# Patient Record
Sex: Female | Born: 1950 | Race: White | Hispanic: No | Marital: Married | Smoking: Never smoker
Health system: Southern US, Community
[De-identification: ages and names within clinical notes are randomized; demographics above are authoritative.]

## PROBLEM LIST (undated history)

## (undated) DIAGNOSIS — M109 Gout, unspecified: Secondary | ICD-10-CM

## (undated) DIAGNOSIS — R011 Cardiac murmur, unspecified: Secondary | ICD-10-CM

## (undated) DIAGNOSIS — E669 Obesity, unspecified: Secondary | ICD-10-CM

## (undated) DIAGNOSIS — H25013 Cortical age-related cataract, bilateral: Secondary | ICD-10-CM

## (undated) DIAGNOSIS — R51 Headache: Secondary | ICD-10-CM

## (undated) DIAGNOSIS — I10 Essential (primary) hypertension: Secondary | ICD-10-CM

## (undated) DIAGNOSIS — K635 Polyp of colon: Secondary | ICD-10-CM

## (undated) DIAGNOSIS — Z8489 Family history of other specified conditions: Secondary | ICD-10-CM

## (undated) DIAGNOSIS — M1990 Unspecified osteoarthritis, unspecified site: Secondary | ICD-10-CM

## (undated) DIAGNOSIS — K219 Gastro-esophageal reflux disease without esophagitis: Secondary | ICD-10-CM

## (undated) DIAGNOSIS — D649 Anemia, unspecified: Secondary | ICD-10-CM

## (undated) DIAGNOSIS — R519 Headache, unspecified: Secondary | ICD-10-CM

## (undated) DIAGNOSIS — E7800 Pure hypercholesterolemia, unspecified: Secondary | ICD-10-CM

## (undated) DIAGNOSIS — G4700 Insomnia, unspecified: Secondary | ICD-10-CM

## (undated) HISTORY — PX: COLONOSCOPY: SHX174

## (undated) HISTORY — PX: TUBAL LIGATION: SHX77

## (undated) HISTORY — PX: BILATERAL CARPAL TUNNEL RELEASE: SHX6508

## (undated) HISTORY — PX: EYE SURGERY: SHX253

---

## 1999-09-18 HISTORY — PX: FLEXIBLE SIGMOIDOSCOPY: SHX1649

## 2005-07-10 ENCOUNTER — Ambulatory Visit: Payer: Self-pay | Admitting: Internal Medicine

## 2005-08-22 ENCOUNTER — Ambulatory Visit: Payer: Self-pay | Admitting: Gastroenterology

## 2005-08-22 HISTORY — PX: COLONOSCOPY: SHX174

## 2005-12-22 ENCOUNTER — Ambulatory Visit: Payer: Self-pay | Admitting: Ophthalmology

## 2006-07-13 ENCOUNTER — Ambulatory Visit: Payer: Self-pay | Admitting: Internal Medicine

## 2006-12-28 ENCOUNTER — Other Ambulatory Visit: Payer: Self-pay

## 2006-12-28 ENCOUNTER — Ambulatory Visit: Payer: Self-pay | Admitting: Ophthalmology

## 2007-01-04 ENCOUNTER — Ambulatory Visit: Payer: Self-pay | Admitting: Ophthalmology

## 2007-07-20 ENCOUNTER — Ambulatory Visit: Payer: Self-pay | Admitting: Internal Medicine

## 2008-07-20 ENCOUNTER — Ambulatory Visit: Payer: Self-pay | Admitting: Internal Medicine

## 2008-11-25 ENCOUNTER — Emergency Department: Payer: Self-pay | Admitting: Emergency Medicine

## 2008-11-26 ENCOUNTER — Inpatient Hospital Stay: Payer: Self-pay | Admitting: Internal Medicine

## 2009-07-24 ENCOUNTER — Ambulatory Visit: Payer: Self-pay | Admitting: Internal Medicine

## 2010-08-13 ENCOUNTER — Ambulatory Visit: Payer: Self-pay | Admitting: Internal Medicine

## 2011-09-04 ENCOUNTER — Ambulatory Visit: Payer: Self-pay | Admitting: Internal Medicine

## 2012-09-07 ENCOUNTER — Ambulatory Visit: Payer: Self-pay | Admitting: Internal Medicine

## 2013-09-12 ENCOUNTER — Ambulatory Visit: Payer: Self-pay | Admitting: Family Medicine

## 2014-08-22 ENCOUNTER — Other Ambulatory Visit: Payer: Self-pay | Admitting: Family Medicine

## 2014-08-22 DIAGNOSIS — Z1231 Encounter for screening mammogram for malignant neoplasm of breast: Secondary | ICD-10-CM

## 2014-09-14 ENCOUNTER — Other Ambulatory Visit: Payer: Self-pay | Admitting: Family Medicine

## 2014-09-14 ENCOUNTER — Ambulatory Visit
Admission: RE | Admit: 2014-09-14 | Discharge: 2014-09-14 | Disposition: A | Discharge status: Home / Self Care | Payer: Medicare Other | Source: Ambulatory Visit | Attending: Family Medicine | Admitting: Family Medicine

## 2014-09-14 DIAGNOSIS — Z1231 Encounter for screening mammogram for malignant neoplasm of breast: Secondary | ICD-10-CM | POA: Insufficient documentation

## 2014-09-14 DIAGNOSIS — R922 Inconclusive mammogram: Secondary | ICD-10-CM | POA: Insufficient documentation

## 2015-08-22 ENCOUNTER — Other Ambulatory Visit: Payer: Self-pay | Admitting: Physician Assistant

## 2015-08-22 ENCOUNTER — Ambulatory Visit
Admission: RE | Admit: 2015-08-22 | Discharge: 2015-08-22 | Disposition: A | Discharge status: Home / Self Care | Payer: Medicare Other | Source: Ambulatory Visit | Attending: Physician Assistant | Admitting: Physician Assistant

## 2015-08-22 DIAGNOSIS — M25562 Pain in left knee: Secondary | ICD-10-CM | POA: Diagnosis present

## 2015-09-17 ENCOUNTER — Other Ambulatory Visit: Payer: Self-pay | Admitting: Student

## 2015-09-17 DIAGNOSIS — M25562 Pain in left knee: Secondary | ICD-10-CM

## 2015-09-27 DIAGNOSIS — Z8739 Personal history of other diseases of the musculoskeletal system and connective tissue: Secondary | ICD-10-CM | POA: Insufficient documentation

## 2015-10-09 ENCOUNTER — Ambulatory Visit
Admission: RE | Admit: 2015-10-09 | Discharge: 2015-10-09 | Disposition: A | Discharge status: Home / Self Care | Payer: Medicare Other | Source: Ambulatory Visit | Attending: Student | Admitting: Student

## 2015-10-09 DIAGNOSIS — S83242A Other tear of medial meniscus, current injury, left knee, initial encounter: Secondary | ICD-10-CM | POA: Diagnosis not present

## 2015-10-09 DIAGNOSIS — M7122 Synovial cyst of popliteal space [Baker], left knee: Secondary | ICD-10-CM | POA: Insufficient documentation

## 2015-10-09 DIAGNOSIS — M25462 Effusion, left knee: Secondary | ICD-10-CM | POA: Insufficient documentation

## 2015-10-09 DIAGNOSIS — M25562 Pain in left knee: Secondary | ICD-10-CM | POA: Insufficient documentation

## 2015-10-09 DIAGNOSIS — X58XXXA Exposure to other specified factors, initial encounter: Secondary | ICD-10-CM | POA: Insufficient documentation

## 2015-11-07 ENCOUNTER — Other Ambulatory Visit: Payer: Self-pay | Admitting: Family Medicine

## 2015-11-07 DIAGNOSIS — Z1231 Encounter for screening mammogram for malignant neoplasm of breast: Secondary | ICD-10-CM

## 2015-11-22 ENCOUNTER — Ambulatory Visit
Admission: RE | Admit: 2015-11-22 | Discharge: 2015-11-22 | Disposition: A | Discharge status: Home / Self Care | Payer: Medicare Other | Source: Ambulatory Visit | Attending: Family Medicine | Admitting: Family Medicine

## 2015-11-22 ENCOUNTER — Other Ambulatory Visit: Payer: Self-pay | Admitting: Family Medicine

## 2015-11-22 DIAGNOSIS — Z1231 Encounter for screening mammogram for malignant neoplasm of breast: Secondary | ICD-10-CM | POA: Diagnosis not present

## 2016-02-06 NOTE — Patient Instructions (Signed)
  Your procedure is scheduled on: 02-14-16 [facility]) Report to Same Day Surgery 2nd floor medical mall To find out your arrival time please call [phone] between 1PM - 3PM on 02-13-16 [facility])  Remember: Instructions that are not followed completely may result in serious medical risk, up to and including death, or upon the discretion of your surgeon and anesthesiologist your surgery may need to be rescheduled.    _x___ 1. Do not eat food or drink liquids after midnight. No gum chewing or hard candies.     __x__ 2. No Alcohol for 24 hours before or after surgery.   __x__3. No Smoking for 24 prior to surgery.   ____  4. Bring all medications with you on the day of surgery if instructed.    __x__ 5. Notify your doctor if there is any change in your medical condition     (cold, fever, infections).     Do not wear jewelry, make-up, hairpins, clips or nail polish.  Do not wear lotions, powders, or perfumes. You may wear deodorant.  Do not shave 48 hours prior to surgery. Men may shave face and neck.  Do not bring valuables to the hospital.    [facility] is not responsible for any belongings or valuables.               Contacts, dentures or bridgework may not be worn into surgery.  Leave your suitcase in the car. After surgery it may be brought to your room.  For patients admitted to the hospital, discharge time is determined by your treatment team.   Patients discharged the day of surgery will not be allowed to drive home.    Please read over the following fact sheets that you were given:   [facility] Preparing for Surgery and or MRSA Information   ____ Take these medicines the morning of surgery with A SIP OF WATER:    1. NONE  2.  3.  4.  5.  6.  ____Fleets enema or Magnesium Citrate as directed.   _x___ Use CHG Soap or sage wipes as directed on instruction sheet   ____ Use inhalers on the day of surgery and bring to hospital day of surgery  ____ Stop metformin 2  days prior to surgery    ____ Take 1/2 of usual insulin dose the night before surgery and none on the morning of  surgery.   ____ Stop aspirin or coumadin, or plavix  x__ Stop Anti-inflammatories such as Advil, Aleve, Ibuprofen, Motrin, Naproxen,          Naprosyn, Goodies powders or aspirin products NOW-Ok to take Tylenol.   ____ Stop supplements until after surgery.    ____ Bring C-Pap to the hospital.

## 2016-02-07 ENCOUNTER — Encounter
Admission: RE | Admit: 2016-02-07 | Discharge: 2016-02-07 | Disposition: A | Discharge status: Home / Self Care | Payer: Medicare Other | Source: Ambulatory Visit | Attending: Surgery | Admitting: Surgery

## 2016-02-07 DIAGNOSIS — R9431 Abnormal electrocardiogram [ECG] [EKG]: Secondary | ICD-10-CM | POA: Insufficient documentation

## 2016-02-07 DIAGNOSIS — Z01812 Encounter for preprocedural laboratory examination: Secondary | ICD-10-CM | POA: Insufficient documentation

## 2016-02-07 DIAGNOSIS — I10 Essential (primary) hypertension: Secondary | ICD-10-CM | POA: Diagnosis not present

## 2016-02-07 DIAGNOSIS — Z01810 Encounter for preprocedural cardiovascular examination: Secondary | ICD-10-CM | POA: Insufficient documentation

## 2016-02-07 HISTORY — DX: Headache, unspecified: R51.9

## 2016-02-07 HISTORY — DX: Family history of other specified conditions: Z84.89

## 2016-02-07 HISTORY — DX: Cardiac murmur, unspecified: R01.1

## 2016-02-07 HISTORY — DX: Gout, unspecified: M10.9

## 2016-02-07 HISTORY — DX: Gastro-esophageal reflux disease without esophagitis: K21.9

## 2016-02-07 HISTORY — DX: Essential (primary) hypertension: I10

## 2016-02-07 HISTORY — DX: Anemia, unspecified: D64.9

## 2016-02-07 HISTORY — DX: Headache: R51

## 2016-02-07 LAB — POTASSIUM: POTASSIUM: 3.5 mmol/L (ref 3.5–5.1)

## 2016-02-13 MED ORDER — CEFAZOLIN SODIUM-DEXTROSE 2-4 GM/100ML-% IV SOLN
2.0000 g | Freq: Once | INTRAVENOUS | Status: AC
  Administered 2016-02-14: 2 g via INTRAVENOUS

## 2016-02-14 ENCOUNTER — Ambulatory Visit: Payer: Medicare Other | Admitting: Anesthesiology

## 2016-02-14 ENCOUNTER — Encounter
Admission: RE | Disposition: A | Discharge status: Home / Self Care | Payer: Self-pay | Source: Ambulatory Visit | Attending: Surgery

## 2016-02-14 ENCOUNTER — Ambulatory Visit
Admission: RE | Admit: 2016-02-14 | Discharge: 2016-02-14 | Disposition: A | Discharge status: Home / Self Care | Payer: Medicare Other | Source: Ambulatory Visit | Attending: Surgery | Admitting: Surgery

## 2016-02-14 ENCOUNTER — Encounter: Payer: Self-pay | Admitting: *Deleted

## 2016-02-14 DIAGNOSIS — M23222 Derangement of posterior horn of medial meniscus due to old tear or injury, left knee: Secondary | ICD-10-CM | POA: Insufficient documentation

## 2016-02-14 DIAGNOSIS — I10 Essential (primary) hypertension: Secondary | ICD-10-CM | POA: Insufficient documentation

## 2016-02-14 DIAGNOSIS — M1712 Unilateral primary osteoarthritis, left knee: Secondary | ICD-10-CM | POA: Diagnosis not present

## 2016-02-14 DIAGNOSIS — R011 Cardiac murmur, unspecified: Secondary | ICD-10-CM | POA: Diagnosis not present

## 2016-02-14 DIAGNOSIS — Z6841 Body Mass Index (BMI) 40.0 and over, adult: Secondary | ICD-10-CM | POA: Insufficient documentation

## 2016-02-14 DIAGNOSIS — M2242 Chondromalacia patellae, left knee: Secondary | ICD-10-CM | POA: Diagnosis not present

## 2016-02-14 DIAGNOSIS — K219 Gastro-esophageal reflux disease without esophagitis: Secondary | ICD-10-CM | POA: Diagnosis not present

## 2016-02-14 DIAGNOSIS — D649 Anemia, unspecified: Secondary | ICD-10-CM | POA: Diagnosis not present

## 2016-02-14 HISTORY — PX: KNEE ARTHROSCOPY WITH MENISCAL REPAIR: SHX5653

## 2016-02-14 SURGERY — ARTHROSCOPY, KNEE, WITH MENISCUS REPAIR
Anesthesia: General | Site: Knee | Laterality: Left | Wound class: Clean

## 2016-02-14 MED ORDER — PROPOFOL 10 MG/ML IV BOLUS
INTRAVENOUS | Status: DC | PRN
  Administered 2016-02-14: 150 mg via INTRAVENOUS

## 2016-02-14 MED ORDER — LIDOCAINE HCL (PF) 4 % IJ SOLN
INTRAMUSCULAR | Status: DC | PRN
  Administered 2016-02-14: 4 mL via RESPIRATORY_TRACT

## 2016-02-14 MED ORDER — CEFAZOLIN SODIUM-DEXTROSE 2-4 GM/100ML-% IV SOLN
INTRAVENOUS | Status: AC
  Filled 2016-02-14: qty 100

## 2016-02-14 MED ORDER — ACETAMINOPHEN 10 MG/ML IV SOLN
INTRAVENOUS | Status: AC
  Filled 2016-02-14: qty 100

## 2016-02-14 MED ORDER — FENTANYL CITRATE (PF) 100 MCG/2ML IJ SOLN: 25.0000 ug | INTRAMUSCULAR | Status: DC | PRN

## 2016-02-14 MED ORDER — BUPIVACAINE-EPINEPHRINE (PF) 0.5% -1:200000 IJ SOLN
INTRAMUSCULAR | Status: AC
  Filled 2016-02-14: qty 60

## 2016-02-14 MED ORDER — PHENYLEPHRINE HCL 10 MG/ML IJ SOLN
INTRAMUSCULAR | Status: DC | PRN
  Administered 2016-02-14: 100 ug via INTRAVENOUS
  Administered 2016-02-14 (×2): 150 ug via INTRAVENOUS

## 2016-02-14 MED ORDER — LIDOCAINE HCL (PF) 1 % IJ SOLN
INTRAMUSCULAR | Status: AC
Start: 2016-02-14 — End: 2016-02-14
  Filled 2016-02-14: qty 30

## 2016-02-14 MED ORDER — ONDANSETRON HCL 4 MG/2ML IJ SOLN: 4.0000 mg | Freq: Once | INTRAMUSCULAR | Status: DC | PRN

## 2016-02-14 MED ORDER — FAMOTIDINE 20 MG PO TABS
ORAL_TABLET | ORAL | Status: AC
  Administered 2016-02-14: 20 mg via ORAL
  Filled 2016-02-14: qty 1

## 2016-02-14 MED ORDER — ALBUTEROL SULFATE HFA 108 (90 BASE) MCG/ACT IN AERS
INHALATION_SPRAY | RESPIRATORY_TRACT | Status: DC | PRN
  Administered 2016-02-14 (×2): 4 via RESPIRATORY_TRACT

## 2016-02-14 MED ORDER — MIDAZOLAM HCL 2 MG/2ML IJ SOLN
INTRAMUSCULAR | Status: DC | PRN
  Administered 2016-02-14: 1 mg via INTRAVENOUS

## 2016-02-14 MED ORDER — FENTANYL CITRATE (PF) 100 MCG/2ML IJ SOLN
INTRAMUSCULAR | Status: DC | PRN
  Administered 2016-02-14: 250 ug via INTRAVENOUS

## 2016-02-14 MED ORDER — GLYCOPYRROLATE 0.2 MG/ML IJ SOLN
INTRAMUSCULAR | Status: DC | PRN
  Administered 2016-02-14: 0.4 mg via INTRAVENOUS

## 2016-02-14 MED ORDER — ACETAMINOPHEN 10 MG/ML IV SOLN
INTRAVENOUS | Status: DC | PRN
  Administered 2016-02-14: 1000 mg via INTRAVENOUS

## 2016-02-14 MED ORDER — LACTATED RINGERS IV SOLN
INTRAVENOUS | Status: DC
  Administered 2016-02-14: 07:00:00 via INTRAVENOUS

## 2016-02-14 MED ORDER — HYDROCODONE-ACETAMINOPHEN 5-325 MG PO TABS: 1.0000 | ORAL_TABLET | Freq: Four times a day (QID) | ORAL | 0 refills | Status: DC | PRN

## 2016-02-14 MED ORDER — BUPIVACAINE-EPINEPHRINE (PF) 0.5% -1:200000 IJ SOLN
INTRAMUSCULAR | Status: DC | PRN
  Administered 2016-02-14 (×2): 30 mL via PERINEURAL

## 2016-02-14 MED ORDER — ROCURONIUM BROMIDE 100 MG/10ML IV SOLN
INTRAVENOUS | Status: DC | PRN
  Administered 2016-02-14: 40 mg via INTRAVENOUS

## 2016-02-14 MED ORDER — DEXAMETHASONE SODIUM PHOSPHATE 4 MG/ML IJ SOLN
INTRAMUSCULAR | Status: DC | PRN
  Administered 2016-02-14: 10 mg via INTRAVENOUS

## 2016-02-14 MED ORDER — FAMOTIDINE 20 MG PO TABS
20.0000 mg | ORAL_TABLET | Freq: Once | ORAL | Status: AC
  Administered 2016-02-14: 20 mg via ORAL

## 2016-02-14 MED ORDER — NEOSTIGMINE METHYLSULFATE 10 MG/10ML IV SOLN
INTRAVENOUS | Status: DC | PRN
  Administered 2016-02-14: 3 mg via INTRAVENOUS

## 2016-02-14 MED ORDER — LIDOCAINE HCL (CARDIAC) 20 MG/ML IV SOLN
INTRAVENOUS | Status: DC | PRN
  Administered 2016-02-14 (×2): 100 mg via INTRAVENOUS

## 2016-02-14 MED ORDER — LIDOCAINE HCL 1 % IJ SOLN
INTRAMUSCULAR | Status: DC | PRN
  Administered 2016-02-14: 30 mL

## 2016-02-14 MED ORDER — ONDANSETRON HCL 4 MG/2ML IJ SOLN
INTRAMUSCULAR | Status: DC | PRN
  Administered 2016-02-14: 4 mg via INTRAVENOUS

## 2016-02-14 MED ORDER — LIDOCAINE HCL (PF) 1 % IJ SOLN
INTRAMUSCULAR | Status: AC
  Filled 2016-02-14: qty 30

## 2016-02-14 SURGICAL SUPPLY — 33 items
BAG COUNTER SPONGE EZ (MISCELLANEOUS) IMPLANT
BANDAGE ACE 6X5 VEL STRL LF (GAUZE/BANDAGES/DRESSINGS) ×3 IMPLANT
BLADE FULL RADIUS 3.5 (BLADE) ×3 IMPLANT
BLADE SHAVER 4.5X7 STR FR (MISCELLANEOUS) IMPLANT
CHLORAPREP W/TINT 26ML (MISCELLANEOUS) ×3 IMPLANT
COUNTER SPONGE BAG EZ (MISCELLANEOUS)
CUFF TOURN 24 STER (MISCELLANEOUS) IMPLANT
CUFF TOURN 30 STER DUAL PORT (MISCELLANEOUS) IMPLANT
DRAPE IMP U-DRAPE 54X76 (DRAPES) ×3 IMPLANT
ELECT REM PT RETURN 9FT ADLT (ELECTROSURGICAL) ×3
ELECTRODE REM PT RTRN 9FT ADLT (ELECTROSURGICAL) ×1 IMPLANT
GAUZE SPONGE 4X4 12PLY STRL (GAUZE/BANDAGES/DRESSINGS) ×3 IMPLANT
GLOVE BIO SURGEON STRL SZ8 (GLOVE) ×6 IMPLANT
GLOVE BIOGEL M 7.0 STRL (GLOVE) ×6 IMPLANT
GLOVE BIOGEL PI IND STRL 7.5 (GLOVE) ×1 IMPLANT
GLOVE BIOGEL PI INDICATOR 7.5 (GLOVE) ×2
GLOVE INDICATOR 8.0 STRL GRN (GLOVE) ×3 IMPLANT
GOWN STRL REUS W/ TWL LRG LVL3 (GOWN DISPOSABLE) ×1 IMPLANT
GOWN STRL REUS W/ TWL XL LVL3 (GOWN DISPOSABLE) ×2 IMPLANT
GOWN STRL REUS W/TWL LRG LVL3 (GOWN DISPOSABLE) ×2
GOWN STRL REUS W/TWL XL LVL3 (GOWN DISPOSABLE) ×4
IV LACTATED RINGER IRRG 3000ML (IV SOLUTION) ×2
IV LR IRRIG 3000ML ARTHROMATIC (IV SOLUTION) ×1 IMPLANT
KIT RM TURNOVER STRD PROC AR (KITS) ×3 IMPLANT
MANIFOLD NEPTUNE II (INSTRUMENTS) ×3 IMPLANT
NEEDLE HYPO 21X1.5 SAFETY (NEEDLE) ×3 IMPLANT
PACK ARTHROSCOPY KNEE (MISCELLANEOUS) ×3 IMPLANT
PENCIL ELECTRO HAND CTR (MISCELLANEOUS) ×3 IMPLANT
SUT PROLENE 4 0 PS 2 18 (SUTURE) ×3 IMPLANT
SUT TICRON COATED BLUE 2 0 30 (SUTURE) IMPLANT
SYR 50ML LL SCALE MARK (SYRINGE) ×3 IMPLANT
TUBING ARTHRO INFLOW-ONLY STRL (TUBING) ×3 IMPLANT
WAND HAND CNTRL MULTIVAC 90 (MISCELLANEOUS) ×3 IMPLANT

## 2016-02-14 NOTE — Anesthesia Postprocedure Evaluation (Signed)
Anesthesia Post Note  Patient: Angelica Barnett  Procedure(s) Performed: Procedure(s) (LRB): KNEE ARTHROSCOPY WITH MENISCAL REPAIR,debridement (Left)  Patient location during evaluation: PACU Anesthesia Type: General Level of consciousness: awake and alert Pain management: pain level controlled Vital Signs Assessment: post-procedure vital signs reviewed and stable Respiratory status: spontaneous breathing, nonlabored ventilation, respiratory function stable and patient connected to nasal cannula oxygen Cardiovascular status: blood pressure returned to baseline and stable Postop Assessment: no signs of nausea or vomiting Anesthetic complications: no    Last Vitals:  Vitals:   02/14/16 0941 02/14/16 1031  BP:  (!) 162/69  Pulse:  86  Resp:  15  Temp: 36.6 C     Last Pain:  Vitals:   02/14/16 1031  TempSrc:   PainSc: 0-No pain                 Molli Barrows

## 2016-02-14 NOTE — Transfer of Care (Signed)
Immediate Anesthesia Transfer of Care Note  Patient: Angelica Barnett  Procedure(s) Performed: Procedure(s): KNEE ARTHROSCOPY WITH MENISCAL REPAIR,debridement (Left)  Patient Location: PACU  Anesthesia Type:General  Level of Consciousness: awake, alert , oriented and patient cooperative  Airway & Oxygen Therapy: Patient Spontanous Breathing and Patient connected to nasal cannula oxygen  Post-op Assessment: Report given to RN and Post -op Vital signs reviewed and stable  Post vital signs: Reviewed and stable  Last Vitals:  Vitals:   02/14/16 0607  BP: (!) 189/101  Pulse: 98  Resp: 16  Temp: 36.4 C    Last Pain:  Vitals:   02/14/16 0607  TempSrc: Oral  PainSc: 3          Complications: No apparent anesthesia complications

## 2016-02-14 NOTE — Anesthesia Procedure Notes (Signed)
Procedure Name: Intubation Date/Time: 02/14/2016 7:43 AM Performed by: Rosaria Ferries, Solaris Kram Pre-anesthesia Checklist: Patient identified, Emergency Drugs available, Suction available and Patient being monitored Patient Re-evaluated:Patient Re-evaluated prior to inductionOxygen Delivery Method: Circle system utilized Preoxygenation: Pre-oxygenation with 100% oxygen Intubation Type: IV induction Laryngoscope Size: Mac and 3 Grade View: Grade III Tube type: Oral Tube size: 7.0 mm Number of attempts: 1 Airway Equipment and Method: Bougie stylet Placement Confirmation: positive ETCO2 and breath sounds checked- equal and bilateral Secured at: 22 cm Tube secured with: Tape Dental Injury: Teeth and Oropharynx as per pre-operative assessment  Future Recommendations: Recommend- induction with short-acting agent, and alternative techniques readily available

## 2016-02-14 NOTE — H&P (Signed)
Paper H&P to be scanned into permanent record. H&P reviewed. No changes. 

## 2016-02-14 NOTE — Discharge Instructions (Addendum)
Keep dressing dry and intact.  May shower after dressing changed on post-op day #4 (Monday).  Cover sutures with Band-Aids after drying off. Apply ice frequently to knee. Take ibuprofen 600 mg TID with meals for 7-10 days, then as necessary. Take pain medication as prescribed or ES Tylenol when needed.  May weight-bear as tolerated - use crutches or walker as needed. Follow-up in 10-14 days or as scheduled.    AMBULATORY SURGERY  DISCHARGE INSTRUCTIONS   1) The drugs that you were given will stay in your system until tomorrow so for the next 24 hours you should not:  A) Drive an automobile B) Make any legal decisions C) Drink any alcoholic beverage   2) You may resume regular meals tomorrow.  Today it is better to start with liquids and gradually work up to solid foods.  You may eat anything you prefer, but it is better to start with liquids, then soup and crackers, and gradually work up to solid foods.   3) Please notify your doctor immediately if you have any unusual bleeding, trouble breathing, redness and pain at the surgery site, drainage, fever, or pain not relieved by medication.    4) Additional Instructions:        Please contact your physician with any problems or Same Day Surgery at [phone], Monday through Friday 6 am to 4 pm, or [place] at [facility] number at [phone].

## 2016-02-14 NOTE — Anesthesia Preprocedure Evaluation (Signed)
Anesthesia Evaluation  Patient identified by MRN, date of birth, ID band Patient awake    Reviewed: Allergy & Precautions, H&P , NPO status , Patient's Chart, lab work & pertinent test results, reviewed documented beta blocker date and time   Airway Mallampati: III  TM Distance: >3 FB Neck ROM: full    Dental  (+) Teeth Intact   Pulmonary neg pulmonary ROS,    Pulmonary exam normal        Cardiovascular Exercise Tolerance: Good hypertension, negative cardio ROS Normal cardiovascular exam+ Valvular Problems/Murmurs  Rate:Normal     Neuro/Psych  Headaches, negative neurological ROS  negative psych ROS   GI/Hepatic negative GI ROS, Neg liver ROS, GERD  Medicated,  Endo/Other  negative endocrine ROSMorbid obesity  Renal/GU negative Renal ROS  negative genitourinary   Musculoskeletal   Abdominal   Peds  Hematology negative hematology ROS (+) anemia ,   Anesthesia Other Findings   Reproductive/Obstetrics negative OB ROS                             Anesthesia Physical Anesthesia Plan  ASA: III  Anesthesia Plan: General LMA   Post-op Pain Management:    Induction:   Airway Management Planned:   Additional Equipment:   Intra-op Plan:   Post-operative Plan:   Informed Consent: I have reviewed the patients History and Physical, chart, labs and discussed the procedure including the risks, benefits and alternatives for the proposed anesthesia with the patient or authorized representative who has indicated his/her understanding and acceptance.     Plan Discussed with: CRNA  Anesthesia Plan Comments:         Anesthesia Quick Evaluation

## 2016-02-14 NOTE — Op Note (Signed)
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  Postoperative diagnosis:   Same.  Procedure:   Arthroscopic debridement with partial medial meniscectomy and abrasion chondroplasty of grade 3 chondromalacial changes of the femoral trochlea, left knee.  Surgeon:   Pascal Lux, M.D.  Anesthesia:   GET  Findings:   As above. 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 The anterior posterior cruciate ligaments both were in satisfactory condition, as was the lateral meniscus.  Complications:   None.  EBL:   2 cc.  Total fluids:   600 cc of crystalloid.  Tourniquet time:   None  Drains:   None  Closure:   4-0 Prolene interrupted sutures.  Brief clinical note:   The patient is a 65 year old female with a 6 month history of posterior medial left knee pain. Her symptoms have persisted despite medications, activity modification, etc. Her history and examination were consistent with degenerative joint disease and a medial meniscus tear, confirmed by MRI scan. The patient presents at this time for arthroscopy, debridement, and partial medial meniscectomy.  Procedure:   The patient was brought into the operating room and lain in the supine position. After adequate general endotracheal intubation and anesthesia was obtained, a timeout was performed to verify the appropriate side. The patient's left knee was injected sterilely using a solution of 30 cc of 1% lidocaine and 30 cc of 0.5% Sensorcaine with epinephrine. The left lower extremity was prepped with ChloraPrep solution before being draped sterilely. Preoperative antibiotics were administered. The expected portal sites were injected with 0.5% Sensorcaine with epinephrine before the camera was placed in the anterolateral portal  and instrumentation performed through the anteromedial portal. The knee was sequentially examined beginning in the suprapatellar pouch, then progressing to the patellofemoral space, the medial gutter compartment, the notch, and finally the lateral compartment and gutter. The findings were as described above. Abundant reactive synovial tissues anteriorly were debrided using the full-radius resector in order to improve visualization. The radial tear of the posterior portion of the medial meniscus was debrided back to stable margins using a combination of the straight and up-biting mini much was, as well as the full-radius resector. Subsequent probing demonstrated excellent stability. The area of grade 3 chondromalacial changes involving the femoral trochlea were debrided back to stable margins using the full-radius resector. The other areas of degenerative change did not require abrasion chondroplasty. Hemostasis was achieved using the ArthroCare wand. The instruments were removed from the joint after suctioning the excess fluid. The portal sites were closed using 4-0 Prolene interrupted sutures before a sterile bulky dressing was applied to the knee. The patient was then awakened, extubated, and returned to the recovery room in satisfactory condition after tolerating the procedure well.

## 2016-02-15 DIAGNOSIS — M1712 Unilateral primary osteoarthritis, left knee: Secondary | ICD-10-CM | POA: Insufficient documentation

## 2016-02-15 NOTE — OR Nursing (Signed)
Patient reported a cracked front tooth on post op call today. No pain.  Will call dentist today.  Anesthesia notified.

## 2016-02-18 ENCOUNTER — Encounter: Payer: Self-pay | Admitting: Surgery

## 2016-03-10 DIAGNOSIS — E7800 Pure hypercholesterolemia, unspecified: Secondary | ICD-10-CM | POA: Insufficient documentation

## 2016-12-05 ENCOUNTER — Encounter: Payer: Self-pay | Admitting: *Deleted

## 2016-12-08 ENCOUNTER — Ambulatory Visit
Admission: RE | Admit: 2016-12-08 | Discharge: 2016-12-08 | Disposition: A | Discharge status: Home / Self Care | Payer: Medicare Other | Source: Ambulatory Visit | Attending: Gastroenterology | Admitting: Gastroenterology

## 2016-12-08 ENCOUNTER — Encounter
Admission: RE | Disposition: A | Discharge status: Home / Self Care | Payer: Self-pay | Source: Ambulatory Visit | Attending: Gastroenterology

## 2016-12-08 ENCOUNTER — Ambulatory Visit: Payer: Medicare Other | Admitting: Anesthesiology

## 2016-12-08 ENCOUNTER — Encounter: Payer: Self-pay | Admitting: Anesthesiology

## 2016-12-08 DIAGNOSIS — K641 Second degree hemorrhoids: Secondary | ICD-10-CM | POA: Diagnosis not present

## 2016-12-08 DIAGNOSIS — Z79899 Other long term (current) drug therapy: Secondary | ICD-10-CM | POA: Diagnosis not present

## 2016-12-08 DIAGNOSIS — E669 Obesity, unspecified: Secondary | ICD-10-CM | POA: Diagnosis not present

## 2016-12-08 DIAGNOSIS — G43909 Migraine, unspecified, not intractable, without status migrainosus: Secondary | ICD-10-CM | POA: Insufficient documentation

## 2016-12-08 DIAGNOSIS — M109 Gout, unspecified: Secondary | ICD-10-CM | POA: Diagnosis not present

## 2016-12-08 DIAGNOSIS — D127 Benign neoplasm of rectosigmoid junction: Secondary | ICD-10-CM | POA: Diagnosis not present

## 2016-12-08 DIAGNOSIS — G4700 Insomnia, unspecified: Secondary | ICD-10-CM | POA: Diagnosis not present

## 2016-12-08 DIAGNOSIS — R011 Cardiac murmur, unspecified: Secondary | ICD-10-CM | POA: Insufficient documentation

## 2016-12-08 DIAGNOSIS — K644 Residual hemorrhoidal skin tags: Secondary | ICD-10-CM | POA: Diagnosis not present

## 2016-12-08 DIAGNOSIS — D649 Anemia, unspecified: Secondary | ICD-10-CM | POA: Insufficient documentation

## 2016-12-08 DIAGNOSIS — K5730 Diverticulosis of large intestine without perforation or abscess without bleeding: Secondary | ICD-10-CM | POA: Insufficient documentation

## 2016-12-08 DIAGNOSIS — M1990 Unspecified osteoarthritis, unspecified site: Secondary | ICD-10-CM | POA: Insufficient documentation

## 2016-12-08 DIAGNOSIS — I10 Essential (primary) hypertension: Secondary | ICD-10-CM | POA: Diagnosis not present

## 2016-12-08 DIAGNOSIS — K219 Gastro-esophageal reflux disease without esophagitis: Secondary | ICD-10-CM | POA: Insufficient documentation

## 2016-12-08 DIAGNOSIS — Z1211 Encounter for screening for malignant neoplasm of colon: Secondary | ICD-10-CM | POA: Diagnosis present

## 2016-12-08 DIAGNOSIS — Z6839 Body mass index (BMI) 39.0-39.9, adult: Secondary | ICD-10-CM | POA: Diagnosis not present

## 2016-12-08 DIAGNOSIS — K621 Rectal polyp: Secondary | ICD-10-CM | POA: Diagnosis not present

## 2016-12-08 HISTORY — DX: Obesity, unspecified: E66.9

## 2016-12-08 HISTORY — DX: Unspecified osteoarthritis, unspecified site: M19.90

## 2016-12-08 HISTORY — DX: Insomnia, unspecified: G47.00

## 2016-12-08 HISTORY — PX: COLONOSCOPY WITH PROPOFOL: SHX5780

## 2016-12-08 SURGERY — COLONOSCOPY WITH PROPOFOL
Anesthesia: General

## 2016-12-08 MED ORDER — PROPOFOL 10 MG/ML IV BOLUS
INTRAVENOUS | Status: DC | PRN
  Administered 2016-12-08: 100 mg via INTRAVENOUS

## 2016-12-08 MED ORDER — SODIUM CHLORIDE 0.9 % IV SOLN
INTRAVENOUS | Status: DC
  Administered 2016-12-08: 13:00:00 via INTRAVENOUS
  Administered 2016-12-08: 1000 mL via INTRAVENOUS

## 2016-12-08 MED ORDER — SODIUM CHLORIDE 0.9 % IV SOLN: INTRAVENOUS | Status: DC

## 2016-12-08 MED ORDER — ONDANSETRON HCL 4 MG/2ML IJ SOLN: 4.0000 mg | Freq: Once | INTRAMUSCULAR | Status: DC | PRN

## 2016-12-08 MED ORDER — PROPOFOL 500 MG/50ML IV EMUL
INTRAVENOUS | Status: DC | PRN
  Administered 2016-12-08: 150 ug/kg/min via INTRAVENOUS

## 2016-12-08 MED ORDER — PROPOFOL 500 MG/50ML IV EMUL
INTRAVENOUS | Status: AC
Start: 2016-12-08 — End: 2016-12-08
  Filled 2016-12-08: qty 50

## 2016-12-08 MED ORDER — PHENYLEPHRINE HCL 10 MG/ML IJ SOLN
INTRAMUSCULAR | Status: AC
Start: 2016-12-08 — End: 2016-12-08
  Filled 2016-12-08: qty 1

## 2016-12-08 MED ORDER — FENTANYL CITRATE (PF) 100 MCG/2ML IJ SOLN: 25.0000 ug | INTRAMUSCULAR | Status: DC | PRN

## 2016-12-08 MED ORDER — PHENYLEPHRINE HCL 10 MG/ML IJ SOLN
INTRAMUSCULAR | Status: DC | PRN
  Administered 2016-12-08: 100 ug via INTRAVENOUS

## 2016-12-08 NOTE — Op Note (Signed)
[facility] Gastroenterology Patient Name: Angelica Barnett Procedure Date: 12/08/2016 12:39 PM MRN: [id number] Account #: [id number] Date of Birth: 11/11/1950 Admit Type: Outpatient Age: 66 Room: [facility] ENDO ROOM 3 Gender: Female Note Status: Finalized Procedure:            Colonoscopy Indications:          Screening for colorectal malignant neoplasm Providers:            Lollie Sails, MD Referring MD:         Dion Body (Referring MD) Medicines:            Monitored Anesthesia Care Complications:        No immediate complications. Procedure:            Pre-Anesthesia Assessment:                       - ASA Grade Assessment: II - A patient with mild                        systemic disease.                       After obtaining informed consent, the colonoscope was                        passed under direct vision. Throughout the procedure,                        the patient's blood pressure, pulse, and oxygen                        saturations were monitored continuously. The Olympus                        PCF-H180AL colonoscope ( S#: [id number] ) was introduced                        through the anus and advanced to the the cecum,                        identified by appendiceal orifice and ileocecal valve.                        The colonoscopy was performed without difficulty. The                        patient tolerated the procedure well. The quality of                        the bowel preparation was good. Findings:      Multiple small-mouthed diverticula were found in the sigmoid colon and       descending colon.      A 1 mm polyp was found in the rectum. The polyp was sessile. The polyp       was removed with a cold biopsy forceps. Resection and retrieval were       complete.      Two sessile polyps were found in the recto-sigmoid colon. The polyps       were 1 to 2 mm in size. These polyps were removed with a cold biopsy  forceps. Resection  and retrieval were complete.      Non-bleeding external and internal hemorrhoids were found during       retroflexion, during perianal exam and during anoscopy. The hemorrhoids       were small and Grade II (internal hemorrhoids that prolapse but reduce       spontaneously). Impression:           - Diverticulosis in the sigmoid colon and in the                        descending colon.                       - One 1 mm polyp in the rectum, removed with a cold                        biopsy forceps. Resected and retrieved.                       - Two 1 to 2 mm polyps at the recto-sigmoid colon,                        removed with a cold biopsy forceps. Resected and                        retrieved.                       - Non-bleeding external and internal hemorrhoids. Recommendation:       - Await pathology results.                       - Telephone GI clinic for pathology results in 1 week. Procedure Code(s):    --- Professional ---                       [phone], Colonoscopy, flexible; with biopsy, single or                        multiple Diagnosis Code(s):    --- Professional ---                       Z12.11, Encounter for screening for malignant neoplasm                        of colon                       K64.1, Second degree hemorrhoids                       K62.1, Rectal polyp                       D12.7, Benign neoplasm of rectosigmoid junction                       K57.30, Diverticulosis of large intestine without                        perforation or abscess without bleeding CPT copyright 2016 American Medical Association. All rights reserved. The codes documented in this report are preliminary  and upon coder review may  be revised to meet current compliance requirements. Lollie Sails, MD 12/08/2016 1:11:23 PM This report has been signed electronically. Number of Addenda: 0 Note Initiated On: 12/08/2016 12:39 PM Scope Withdrawal Time: 0 hours 11 minutes 54 seconds  Total  Procedure Duration: 0 hours 19 minutes 15 seconds       [facility]

## 2016-12-08 NOTE — Anesthesia Post-op Follow-up Note (Signed)
Anesthesia QCDR form completed.        

## 2016-12-08 NOTE — Transfer of Care (Signed)
Immediate Anesthesia Transfer of Care Note  Patient: Angelica Barnett  Procedure(s) Performed: Procedure(s): COLONOSCOPY WITH PROPOFOL (N/A)  Patient Location: PACU  Anesthesia Type:General  Level of Consciousness: awake, alert  and oriented  Airway & Oxygen Therapy: Patient Spontanous Breathing and Patient connected to nasal cannula oxygen  Post-op Assessment: Report given to RN and Post -op Vital signs reviewed and stable  Post vital signs: Reviewed and stable  Last Vitals:  Vitals:   12/08/16 1206  BP: (!) 155/94  Pulse: 92  Resp: 18  Temp: 36.6 C  SpO2: 100%    Last Pain:  Vitals:   12/08/16 1206  TempSrc: Tympanic         Complications: No apparent anesthesia complications

## 2016-12-08 NOTE — H&P (Signed)
Outpatient short stay form Pre-procedure 12/08/2016 12:34 PM Lollie Sails MD  Primary Physician: Dr Dion Body  Reason for visit:  Screening colonoscopy  History of present illness:  Patient is a 66 year old female presenting today as above. Patient's last colonoscopy was in 2007. At that time there were apparently no findings with the exception of some diverticulosis in the sigmoid and some small internal hemorrhoids. She tolerated her prep well. She takes no aspirin or blood thinning agents.  Current Facility-Administered Medications:  .  0.9 %  sodium chloride infusion, , Intravenous, Continuous, Lollie Sails, MD, Last Rate: 20 mL/hr at 12/08/16 1231, 1,000 mL at 12/08/16 1231 .  0.9 %  sodium chloride infusion, , Intravenous, Continuous, Lollie Sails, MD .  fentaNYL (SUBLIMAZE) injection 25 mcg, 25 mcg, Intravenous, Q5 min PRN, Alvin Critchley, MD .  ondansetron [facility]) injection 4 mg, 4 mg, Intravenous, Once PRN, Alvin Critchley, MD  Prescriptions Prior to Admission  Medication Sig Dispense Refill Last Dose  . acetaminophen (TYLENOL) 500 MG tablet Take 1,000 mg by mouth every 8 (eight) hours as needed for mild pain.   02/13/2016 at Unknown time  . allopurinol (ZYLOPRIM) 100 MG tablet Take 200 mg by mouth at bedtime.    12/07/2016 at Unknown time  . Calcium Carbonate-Vitamin D (CALCIUM-VITAMIN D) 500-200 MG-UNIT tablet Take 1 tablet by mouth daily.   Past Week at Unknown time  . cetirizine (ZYRTEC) 10 MG tablet Take 10 mg by mouth at bedtime.   12/07/2016 at Unknown time  . ibuprofen (ADVIL,MOTRIN) 200 MG tablet Take 400-600 mg by mouth at bedtime. AND PRN    02/06/2016  . indomethacin (INDOCIN) 50 MG capsule Take 50 mg by mouth as needed for mild pain or moderate pain.      Marland Kitchen losartan (COZAAR) 50 MG tablet Take 50 mg by mouth daily.   12/08/2016 at 0830  . HYDROcodone-acetaminophen (NORCO) 5-325 MG tablet Take 1-2 tablets by mouth every 6 (six) hours as needed for  moderate pain. MAXIMUM TOTAL ACETAMINOPHEN DOSE IS 4000 MG PER DAY (Patient not taking: Reported on 12/08/2016) 40 tablet 0 Completed Course at Unknown time  . triamterene-hydrochlorothiazide (MAXZIDE-25) 37.5-25 MG tablet Take 1 tablet by mouth every morning.   Not Taking at Unknown time     Allergies  Allergen Reactions  . Celebrex [Celecoxib] Hives and Swelling     Past Medical History:  Diagnosis Date  . Anemia    H/O WITH C/S  . Arthritis   . Family history of adverse reaction to anesthesia    PT MOM-N/V  . GERD (gastroesophageal reflux disease)    NO MEDS  . Gout   . Headache    H/O MIGRAINES  . Heart murmur   . Hypertension   . Insomnia   . Obesity     Review of systems:      Physical Exam    Heart and lungs: Regular rate and rhythm without rub or gallop, lungs are bilaterally clear    HEENT:  normal cephalic atraumatic eyes are anicteric    Other:     Pertinant exam for procedure:  soft nontender nondistended bowel sounds positive normoactive    Planned proceedures:  colonoscopy and indicated procedures. I have discussed the risks benefits and complications of procedures to include not limited to bleeding, infection, perforation and the risk of sedation and the patient wishes to proceed.    Lollie Sails, MD Gastroenterology 12/08/2016  12:34 PM

## 2016-12-08 NOTE — Anesthesia Postprocedure Evaluation (Signed)
Anesthesia Post Note  Patient: Angelica Barnett  Procedure(s) Performed: Procedure(s) (LRB): COLONOSCOPY WITH PROPOFOL (N/A)  Patient location during evaluation: PACU Anesthesia Type: General Level of consciousness: awake and alert and oriented Pain management: pain level controlled Vital Signs Assessment: post-procedure vital signs reviewed and stable Respiratory status: spontaneous breathing Cardiovascular status: blood pressure returned to baseline Anesthetic complications: no     Last Vitals:  Vitals:   12/08/16 1206 12/08/16 1314  BP: (!) 155/94 118/61  Pulse: 92 86  Resp: 18   Temp: 36.6 C (!) 36.1 C  SpO2: 100% 98%    Last Pain:  Vitals:   12/08/16 1314  TempSrc: Tympanic                 Kristi Hyer

## 2016-12-08 NOTE — Anesthesia Procedure Notes (Signed)
Date/Time: 12/08/2016 12:49 PM Performed by: Nelda Marseille Pre-anesthesia Checklist: Patient identified, Emergency Drugs available, Suction available, Patient being monitored and Timeout performed Oxygen Delivery Method: Nasal cannula

## 2016-12-08 NOTE — Anesthesia Preprocedure Evaluation (Signed)
Anesthesia Evaluation  Patient identified by MRN, date of birth, ID band Patient awake    Reviewed: Allergy & Precautions, NPO status , Patient's Chart, lab work & pertinent test results  History of Anesthesia Complications (+) Family history of anesthesia reaction  Airway Mallampati: II  TM Distance: >3 FB     Dental  (+) Teeth Intact   Pulmonary    Pulmonary exam normal        Cardiovascular hypertension, Pt. on medications Normal cardiovascular exam+ Valvular Problems/Murmurs      Neuro/Psych  Headaches, negative psych ROS   GI/Hepatic Neg liver ROS, GERD  Medicated,  Endo/Other  negative endocrine ROS  Renal/GU negative Renal ROS  negative genitourinary   Musculoskeletal  (+) Arthritis ,   Abdominal Normal abdominal exam  (+)   Peds negative pediatric ROS (+)  Hematology  (+) anemia ,   Anesthesia Other Findings   Reproductive/Obstetrics                             Anesthesia Physical Anesthesia Plan  ASA: II  Anesthesia Plan: General   Post-op Pain Management:    Induction: Intravenous  PONV Risk Score and Plan:   Airway Management Planned: Nasal Cannula  Additional Equipment:   Intra-op Plan:   Post-operative Plan:   Informed Consent: I have reviewed the patients History and Physical, chart, labs and discussed the procedure including the risks, benefits and alternatives for the proposed anesthesia with the patient or authorized representative who has indicated his/her understanding and acceptance.   Dental advisory given  Plan Discussed with: CRNA and Surgeon  Anesthesia Plan Comments:         Anesthesia Quick Evaluation

## 2016-12-09 ENCOUNTER — Encounter: Payer: Self-pay | Admitting: Gastroenterology

## 2016-12-09 LAB — SURGICAL PATHOLOGY

## 2017-01-01 ENCOUNTER — Other Ambulatory Visit: Payer: Self-pay | Admitting: Family Medicine

## 2017-01-01 DIAGNOSIS — Z1231 Encounter for screening mammogram for malignant neoplasm of breast: Secondary | ICD-10-CM

## 2017-01-15 ENCOUNTER — Ambulatory Visit
Admission: RE | Admit: 2017-01-15 | Discharge: 2017-01-15 | Disposition: A | Discharge status: Home / Self Care | Payer: Medicare Other | Source: Ambulatory Visit | Attending: Family Medicine | Admitting: Family Medicine

## 2017-01-15 DIAGNOSIS — Z1231 Encounter for screening mammogram for malignant neoplasm of breast: Secondary | ICD-10-CM | POA: Diagnosis present

## 2018-02-19 ENCOUNTER — Other Ambulatory Visit: Payer: Self-pay | Admitting: Family Medicine

## 2018-02-19 DIAGNOSIS — Z1231 Encounter for screening mammogram for malignant neoplasm of breast: Secondary | ICD-10-CM

## 2018-03-19 ENCOUNTER — Ambulatory Visit
Admission: RE | Admit: 2018-03-19 | Discharge: 2018-03-19 | Disposition: A | Discharge status: Home / Self Care | Payer: Medicare Other | Source: Ambulatory Visit | Attending: Family Medicine | Admitting: Family Medicine

## 2018-03-19 DIAGNOSIS — Z1231 Encounter for screening mammogram for malignant neoplasm of breast: Secondary | ICD-10-CM | POA: Insufficient documentation

## 2018-12-25 ENCOUNTER — Emergency Department: Payer: Medicare Other

## 2018-12-25 ENCOUNTER — Encounter: Payer: Self-pay | Admitting: Emergency Medicine

## 2018-12-25 ENCOUNTER — Other Ambulatory Visit: Payer: Self-pay

## 2018-12-25 ENCOUNTER — Emergency Department
Admission: EM | Admit: 2018-12-25 | Discharge: 2018-12-25 | Disposition: A | Discharge status: Home / Self Care | Payer: Medicare Other | Attending: Student | Admitting: Student

## 2018-12-25 DIAGNOSIS — Z79899 Other long term (current) drug therapy: Secondary | ICD-10-CM | POA: Insufficient documentation

## 2018-12-25 DIAGNOSIS — R519 Headache, unspecified: Secondary | ICD-10-CM

## 2018-12-25 DIAGNOSIS — I10 Essential (primary) hypertension: Secondary | ICD-10-CM | POA: Diagnosis not present

## 2018-12-25 DIAGNOSIS — R51 Headache: Secondary | ICD-10-CM | POA: Insufficient documentation

## 2018-12-25 LAB — BASIC METABOLIC PANEL
Anion gap: 10 (ref 5–15)
BUN: 13 mg/dL (ref 8–23)
CO2: 26 mmol/L (ref 22–32)
Calcium: 9.4 mg/dL (ref 8.9–10.3)
Chloride: 105 mmol/L (ref 98–111)
Creatinine, Ser: 0.75 mg/dL (ref 0.44–1.00)
GFR calc Af Amer: >60 mL/min (ref >60)
GFR calc non Af Amer: >60 mL/min (ref >60)
Glucose, Bld: 110 mg/dL — ABNORMAL HIGH (ref 70–99)
Potassium: 4 mmol/L (ref 3.5–5.1)
Sodium: 141 mmol/L (ref 135–145)

## 2018-12-25 LAB — CBC
HCT: 41.3 % (ref 36.0–46.0)
Hemoglobin: 13.7 g/dL (ref 12.0–15.0)
MCH: 31.6 pg (ref 26.0–34.0)
MCHC: 33.2 g/dL (ref 30.0–36.0)
MCV: 95.2 fL (ref 80.0–100.0)
Platelets: 301 10*3/uL (ref 150–400)
RBC: 4.34 MIL/uL (ref 3.87–5.11)
RDW: 12.7 % (ref 11.5–15.5)
WBC: 7.7 10*3/uL (ref 4.0–10.5)
nRBC: 0 % (ref 0.0–0.2)

## 2018-12-25 MED ORDER — ACETAMINOPHEN 325 MG PO TABS: 650.0000 mg | ORAL_TABLET | Freq: Once | ORAL | Status: DC

## 2018-12-25 MED ORDER — SODIUM CHLORIDE 0.9 % IV BOLUS
1000.0000 mL | Freq: Once | INTRAVENOUS | Status: AC
  Administered 2018-12-25: 1000 mL via INTRAVENOUS

## 2018-12-25 MED ORDER — METOCLOPRAMIDE HCL 5 MG/ML IJ SOLN
10.0000 mg | Freq: Once | INTRAMUSCULAR | Status: AC
  Administered 2018-12-25: 10 mg via INTRAVENOUS
  Filled 2018-12-25: qty 2

## 2018-12-25 MED ORDER — BUTALBITAL-APAP-CAFFEINE 50-325-40 MG PO TABS: 1.0000 | ORAL_TABLET | Freq: Once | ORAL | Status: DC

## 2018-12-25 MED ORDER — DIPHENHYDRAMINE HCL 50 MG/ML IJ SOLN
25.0000 mg | Freq: Once | INTRAMUSCULAR | Status: AC
  Administered 2018-12-25: 25 mg via INTRAVENOUS
  Filled 2018-12-25: qty 1

## 2018-12-25 MED ORDER — ACETAMINOPHEN 500 MG PO TABS
1000.0000 mg | ORAL_TABLET | Freq: Once | ORAL | Status: AC
  Administered 2018-12-25: 13:00:00 1000 mg via ORAL
  Filled 2018-12-25: qty 2

## 2018-12-25 NOTE — ED Notes (Signed)
Pt unhooked to go to restroom 

## 2018-12-25 NOTE — ED Provider Notes (Signed)
[facility] Emergency Department Provider Note  ____________________________________________   First MD Initiated Contact with Patient 12/25/18 1243     (approximate)  I have reviewed the triage vital signs and the nursing notes.  History  Chief Complaint Headache and Hypertension    HPI Angelica Barnett is a 68 y.o. female with a history of hypertension, gout, migraines who presents emergency department for headache and high blood pressure.  Patient states her headache has been present since yesterday.  It is right-sided, sharp and shooting in description.  Patient states the pain comes in "spurts".  These last approximately 30 seconds to a few minutes before spontaneously resolving and then recurring.  She denies any associated visual changes, weakness, numbness, tingling.  No facial sensitivity or pain.  No fevers or neck stiffness.  No fall or trauma.  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  She tried ibuprofen at home with mild relief.  She states she took her blood pressure at home today when she was experiencing a headache, and noted it to be elevated, which is unusual for her.  She takes losartan daily, and reports compliance with this.  She states her blood pressures normally 0000000 or [id number] systolic.         Past Medical Hx Past Medical History:  Diagnosis Date  . Anemia    H/O WITH C/S  . Arthritis   . Family history of adverse reaction to anesthesia    PT MOM-N/V  . GERD (gastroesophageal reflux disease)    NO MEDS  . Gout   . Headache    H/O MIGRAINES  . Heart murmur   . Hypertension   . Insomnia   . Obesity     Problem List There are no active problems to display for this patient.   Past Surgical Hx Past Surgical History:  Procedure Laterality Date  . BILATERAL CARPAL TUNNEL RELEASE    . CESAREAN SECTION     X2  . COLONOSCOPY     . COLONOSCOPY WITH PROPOFOL N/A 12/08/2016   Procedure: COLONOSCOPY WITH PROPOFOL;  Surgeon: Lollie Sails, MD;  Location: [facility] ENDOSCOPY;  Service: Endoscopy;  Laterality: N/A;  . EYE SURGERY Bilateral    CATARACT  . KNEE ARTHROSCOPY WITH MENISCAL REPAIR Left 02/14/2016   Procedure: KNEE ARTHROSCOPY WITH MENISCAL REPAIR,debridement;  Surgeon: Corky Mull, MD;  Location: [facility] ORS;  Service: Orthopedics;  Laterality: Left;    Medications Prior to Admission medications   Medication Sig Start Date End Date Taking? Authorizing Provider  acetaminophen (TYLENOL) 500 MG tablet Take 1,000 mg by mouth every 8 (eight) hours as needed for mild pain.    [provider]  allopurinol (ZYLOPRIM) 100 MG tablet Take 200 mg by mouth at bedtime.     [provider]  Calcium Carbonate-Vitamin D (CALCIUM-VITAMIN D) 500-200 MG-UNIT tablet Take 1 tablet by mouth daily.    [provider]  cetirizine (ZYRTEC) 10 MG tablet Take 10 mg by mouth at bedtime.    [provider]  HYDROcodone-acetaminophen (NORCO) 5-325 MG tablet Take 1-2 tablets by mouth every 6 (six) hours as needed for moderate pain. MAXIMUM TOTAL ACETAMINOPHEN DOSE IS 4000 MG PER DAY Patient not taking: Reported on 12/08/2016 02/14/16   Poggi, Marshall Cork, MD  ibuprofen (ADVIL,MOTRIN) 200 MG tablet Take 400-600 mg by mouth at bedtime. AND PRN  [provider]  indomethacin (INDOCIN) 50 MG capsule Take 50 mg by mouth as needed for mild pain or moderate pain.     [provider]  losartan (COZAAR) 50 MG tablet Take 50 mg by mouth daily.    [provider]  triamterene-hydrochlorothiazide (MAXZIDE-25) 37.5-25 MG tablet Take 1 tablet by mouth every morning.    [provider]    Allergies Celecoxib  Family Hx Family History  Problem Relation Age of Onset  . Breast cancer Sister 87    Social Hx Social History   Tobacco Use  . Smoking status: Never Smoker  . Smokeless  tobacco: Never Used  Substance Use Topics  . Alcohol use: Yes    Comment: RARE  . Drug use: No     Review of Systems  Constitutional: Negative for fever. Negative for chills. Eyes: Negative for visual changes. ENT: Negative for sore throat. Cardiovascular: Negative for chest pain. Respiratory: Negative for shortness of breath. Gastrointestinal: Negative for abdominal pain. Negative for nausea. Negative for vomiting. Genitourinary: Negative for dysuria. Musculoskeletal: Negative for leg swelling. Skin: Negative for rash. Neurological: + for for headaches.   Physical Exam  Vital Signs: ED Triage Vitals  Enc Vitals Group     BP 12/25/18 1110 (!) 214/84     Pulse Rate 12/25/18 1110 (!) 113     Resp 12/25/18 1110 18     Temp 12/25/18 1110 98.6 F (37 C)     Temp Source 12/25/18 1110 Oral     SpO2 12/25/18 1110 97 %     Weight 12/25/18 1113 255 lb (115.7 kg)     Height 12/25/18 1113 5\' 4"  (1.626 m)     Head Circumference --      Peak Flow --      Pain Score 12/25/18 1112 8     Pain Loc --      Pain Edu? --      Excl. in [place]? --     Constitutional: Alert and oriented.  Eyes: Conjunctivae clear. Sclera anicteric. Head: Normocephalic. Atraumatic. Ears: No vesicles or lesions in bilateral ear canals. Nose: No congestion. No rhinorrhea. Mouth/Throat: Mucous membranes are moist.  Neck: No stridor.   Cardiovascular: Normal rate, regular rhythm.  Extremities well perfused. Respiratory: Normal respiratory effort.  Lungs CTAB. Gastrointestinal: Soft and non-tender. No distention.  Musculoskeletal: No lower extremity edema. Neurologic:  Normal speech and language. No gross focal neurologic deficits are appreciated. Alert and oriented.  Face symmetric.  Tongue midline.  Cranial nerves II through XII intact. UE and LE strength 5/5 and symmetric. UE and LE SILT.  Skin: Skin is warm, dry and intact. No rash noted.  No vesicles or lesions. Psychiatric: Mood and affect are  appropriate for situation.  EKG  Rate: within normal limits Rhythm: sinus Axis: leftward Intervals: within normal limits No acute ischemic changes No STEMI   Radiology  CT head: IMPRESSION: Negative noncontrast head CT.    Procedures  Procedure(s) performed (including critical care):  Procedures   Initial Impression / Assessment and Plan / ED Course  68 y.o. female who presents to the ED for right-sided, sharp shooting headache, as above.  Ddx: migraine headache, cluster headache, tension headache.  No fevers, neck stiffness, or other infectious etiology suggestive of meningitis.  Not thunderclap in onset or worst of life, do not suspect SAH.  No vesicles on exam suggestive of shingles.  Suspect her blood pressure is elevated in the setting of her pain. Given no associated  altered mental status or other neurological symptoms, do not suspect hypertensive emergency or urgency.  No facial sensitivity, facial pain, suggestive of trigeminal neuralgia.  Plan: Work-up initiated in triage reveals negative head CT, labs without actionable derangements, no acute ischemic changes on EKG.  Will place on oxygen, in case of cluster headache, give migraine medications, and reassess.  2:39 PM Patient reports significant improvement in symptoms after medications, oxygen.  Repeat blood pressure improved,160s/70s.  Patient stable for discharge.  Advised continued supportive care, PCP follow-up and given return precautions.  Patient voices understanding is comfortable with the plan and discharge.   Final Clinical Impression(s) / ED Diagnosis  Final diagnoses:  Complaint of headache  Hypertension, unspecified type      Note:  This document was prepared using Dragon voice recognition software and may include unintentional dictation errors.   Lilia Pro., MD 12/25/18 1440

## 2018-12-25 NOTE — ED Triage Notes (Addendum)
Pt arrived via POV with reports of right sided headache that started yesterday morning. Pt states she has a history of cluster migraines but has been 30+ years since last one.  Pt has hx of HTN and takes Losartan, pt states her BP has been elevated today. Last dose was this morning 50mg .  Pt reports BP was 210/120 at home.  Pt states the headache comes in what she describes as intermittent pulses. Pt denies any N/V, photosensitivity.  Pt neurologically intact, no other sxs noted at this time.

## 2018-12-25 NOTE — Discharge Instructions (Addendum)
Thank you for letting us take care of you in the emergency department today.   Please continue to take any regular, prescribed medications.   - Your primary care doctor to review your ER visit and follow up on your symptoms.   Please return to the ER for any new or worsening symptoms.

## 2018-12-25 NOTE — ED Notes (Signed)
D/w Dr. Kerman Passey, new orders received for CT head, labs and EKG.

## 2019-04-15 DIAGNOSIS — J1282 Pneumonia due to coronavirus disease 2019: Secondary | ICD-10-CM

## 2019-04-15 DIAGNOSIS — U071 COVID-19: Secondary | ICD-10-CM

## 2019-04-15 HISTORY — DX: Pneumonia due to coronavirus disease 2019: J12.82

## 2019-04-15 HISTORY — DX: COVID-19: U07.1

## 2019-05-08 ENCOUNTER — Inpatient Hospital Stay
Admission: EM | Admit: 2019-05-08 | Discharge: 2019-05-09 | DRG: 871 | Disposition: A | Discharge status: Other Acute Inpatient Hospital | Payer: Medicare Other | Attending: Family Medicine | Admitting: Family Medicine

## 2019-05-08 ENCOUNTER — Encounter: Payer: Self-pay | Admitting: Emergency Medicine

## 2019-05-08 ENCOUNTER — Emergency Department: Payer: Medicare Other

## 2019-05-08 ENCOUNTER — Inpatient Hospital Stay: Payer: Medicare Other

## 2019-05-08 ENCOUNTER — Other Ambulatory Visit: Payer: Self-pay

## 2019-05-08 DIAGNOSIS — J9600 Acute respiratory failure, unspecified whether with hypoxia or hypercapnia: Secondary | ICD-10-CM | POA: Diagnosis not present

## 2019-05-08 DIAGNOSIS — Z803 Family history of malignant neoplasm of breast: Secondary | ICD-10-CM | POA: Diagnosis not present

## 2019-05-08 DIAGNOSIS — M1A9XX0 Chronic gout, unspecified, without tophus (tophi): Secondary | ICD-10-CM | POA: Diagnosis present

## 2019-05-08 DIAGNOSIS — M1990 Unspecified osteoarthritis, unspecified site: Secondary | ICD-10-CM | POA: Diagnosis present

## 2019-05-08 DIAGNOSIS — U071 COVID-19: Secondary | ICD-10-CM | POA: Diagnosis present

## 2019-05-08 DIAGNOSIS — D649 Anemia, unspecified: Secondary | ICD-10-CM | POA: Diagnosis present

## 2019-05-08 DIAGNOSIS — J9601 Acute respiratory failure with hypoxia: Secondary | ICD-10-CM | POA: Diagnosis present

## 2019-05-08 DIAGNOSIS — I10 Essential (primary) hypertension: Secondary | ICD-10-CM | POA: Diagnosis not present

## 2019-05-08 DIAGNOSIS — R9431 Abnormal electrocardiogram [ECG] [EKG]: Secondary | ICD-10-CM | POA: Diagnosis not present

## 2019-05-08 DIAGNOSIS — K219 Gastro-esophageal reflux disease without esophagitis: Secondary | ICD-10-CM | POA: Diagnosis present

## 2019-05-08 DIAGNOSIS — A4189 Other specified sepsis: Principal | ICD-10-CM | POA: Diagnosis present

## 2019-05-08 DIAGNOSIS — A0839 Other viral enteritis: Secondary | ICD-10-CM | POA: Diagnosis present

## 2019-05-08 DIAGNOSIS — A419 Sepsis, unspecified organism: Secondary | ICD-10-CM | POA: Diagnosis not present

## 2019-05-08 DIAGNOSIS — M109 Gout, unspecified: Secondary | ICD-10-CM | POA: Diagnosis not present

## 2019-05-08 DIAGNOSIS — R6520 Severe sepsis without septic shock: Secondary | ICD-10-CM | POA: Diagnosis present

## 2019-05-08 DIAGNOSIS — J1282 Pneumonia due to coronavirus disease 2019: Secondary | ICD-10-CM | POA: Diagnosis present

## 2019-05-08 LAB — C-REACTIVE PROTEIN: CRP: 3.5 mg/dL — ABNORMAL HIGH (ref <1.0)

## 2019-05-08 LAB — CBC
HCT: 39.8 % (ref 36.0–46.0)
Hemoglobin: 13.3 g/dL (ref 12.0–15.0)
MCH: 31.3 pg (ref 26.0–34.0)
MCHC: 33.4 g/dL (ref 30.0–36.0)
MCV: 93.6 fL (ref 80.0–100.0)
Platelets: 220 10*3/uL (ref 150–400)
RBC: 4.25 MIL/uL (ref 3.87–5.11)
RDW: 12.9 % (ref 11.5–15.5)
WBC: 7 10*3/uL (ref 4.0–10.5)
nRBC: 0 % (ref 0.0–0.2)

## 2019-05-08 LAB — HIV ANTIBODY (ROUTINE TESTING W REFLEX): HIV Screen 4th Generation wRfx: NONREACTIVE

## 2019-05-08 LAB — TROPONIN I (HIGH SENSITIVITY)
Troponin I (High Sensitivity): 7 ng/L (ref <18)
Troponin I (High Sensitivity): 6 ng/L (ref <18)
Troponin I (High Sensitivity): 5 ng/L (ref <18)
Troponin I (High Sensitivity): 4 ng/L (ref <18)

## 2019-05-08 LAB — COMPREHENSIVE METABOLIC PANEL
ALT: 29 U/L (ref 0–44)
AST: 39 U/L (ref 15–41)
Albumin: 3.6 g/dL (ref 3.5–5.0)
Alkaline Phosphatase: 75 U/L (ref 38–126)
Anion gap: 12 (ref 5–15)
BUN: 11 mg/dL (ref 8–23)
CO2: 28 mmol/L (ref 22–32)
Calcium: 8.6 mg/dL — ABNORMAL LOW (ref 8.9–10.3)
Chloride: 96 mmol/L — ABNORMAL LOW (ref 98–111)
Creatinine, Ser: 0.93 mg/dL (ref 0.44–1.00)
GFR calc Af Amer: >60 mL/min (ref >60)
GFR calc non Af Amer: >60 mL/min (ref >60)
Glucose, Bld: 104 mg/dL — ABNORMAL HIGH (ref 70–99)
Potassium: 3.8 mmol/L (ref 3.5–5.1)
Sodium: 136 mmol/L (ref 135–145)
Total Bilirubin: 0.6 mg/dL (ref 0.3–1.2)
Total Protein: 7.6 g/dL (ref 6.5–8.1)

## 2019-05-08 LAB — CBC WITH DIFFERENTIAL/PLATELET
Abs Immature Granulocytes: 0.06 10*3/uL (ref 0.00–0.07)
Basophils Absolute: 0 10*3/uL (ref 0.0–0.1)
Basophils Relative: 0 %
Eosinophils Absolute: 0 10*3/uL (ref 0.0–0.5)
Eosinophils Relative: 0 %
HCT: 42.2 % (ref 36.0–46.0)
Hemoglobin: 14.2 g/dL (ref 12.0–15.0)
Immature Granulocytes: 1 %
Lymphocytes Relative: 12 %
Lymphs Abs: 0.8 10*3/uL (ref 0.7–4.0)
MCH: 31.8 pg (ref 26.0–34.0)
MCHC: 33.6 g/dL (ref 30.0–36.0)
MCV: 94.4 fL (ref 80.0–100.0)
Monocytes Absolute: 0.4 10*3/uL (ref 0.1–1.0)
Monocytes Relative: 5 %
Neutro Abs: 5.8 10*3/uL (ref 1.7–7.7)
Neutrophils Relative %: 82 %
Platelets: 228 10*3/uL (ref 150–400)
RBC: 4.47 MIL/uL (ref 3.87–5.11)
RDW: 12.6 % (ref 11.5–15.5)
WBC: 7 10*3/uL (ref 4.0–10.5)
nRBC: 0 % (ref 0.0–0.2)

## 2019-05-08 LAB — FIBRIN DERIVATIVES D-DIMER (ARMC ONLY)
Fibrin derivatives D-dimer (ARMC): 824.62 ng/mL (FEU) — ABNORMAL HIGH (ref 0.00–499.00)
Fibrin derivatives D-dimer (ARMC): 790.91 ng/mL (FEU) — ABNORMAL HIGH (ref 0.00–499.00)

## 2019-05-08 LAB — BRAIN NATRIURETIC PEPTIDE: B Natriuretic Peptide: 55 pg/mL (ref 0.0–100.0)

## 2019-05-08 LAB — CREATININE, SERUM
Creatinine, Ser: 0.78 mg/dL (ref 0.44–1.00)
GFR calc Af Amer: >60 mL/min (ref >60)
GFR calc non Af Amer: >60 mL/min (ref >60)

## 2019-05-08 LAB — FIBRINOGEN
Fibrinogen: 505 mg/dL — ABNORMAL HIGH (ref 210–475)
Fibrinogen: 515 mg/dL — ABNORMAL HIGH (ref 210–475)

## 2019-05-08 LAB — LACTATE DEHYDROGENASE
LDH: 262 U/L — ABNORMAL HIGH (ref 98–192)
LDH: 265 U/L — ABNORMAL HIGH (ref 98–192)

## 2019-05-08 LAB — PROCALCITONIN
Procalcitonin: <0.1 ng/mL
Procalcitonin: <0.1 ng/mL

## 2019-05-08 LAB — LACTIC ACID, PLASMA: Lactic Acid, Venous: 1.6 mmol/L (ref 0.5–1.9)

## 2019-05-08 LAB — ABO/RH: ABO/RH(D): A NEG

## 2019-05-08 LAB — TRIGLYCERIDES: Triglycerides: 142 mg/dL (ref <150)

## 2019-05-08 LAB — FERRITIN: Ferritin: 386 ng/mL — ABNORMAL HIGH (ref 11–307)

## 2019-05-08 MED ORDER — SODIUM CHLORIDE 0.9 % IV SOLN
100.0000 mg | Freq: Every day | INTRAVENOUS | Status: DC
  Administered 2019-05-09: 10:00:00 100 mg via INTRAVENOUS
  Filled 2019-05-08 (×2): qty 20

## 2019-05-08 MED ORDER — ASCORBIC ACID 500 MG PO TABS
500.0000 mg | ORAL_TABLET | Freq: Every day | ORAL | Status: DC
  Administered 2019-05-08 – 2019-05-09 (×2): 500 mg via ORAL
  Filled 2019-05-08 (×2): qty 1

## 2019-05-08 MED ORDER — HYDROCOD POLST-CPM POLST ER 10-8 MG/5ML PO SUER: 5.0000 mL | Freq: Two times a day (BID) | ORAL | Status: DC | PRN

## 2019-05-08 MED ORDER — LORATADINE 10 MG PO TABS
10.0000 mg | ORAL_TABLET | Freq: Every day | ORAL | Status: DC
  Administered 2019-05-08: 21:00:00 10 mg via ORAL
  Filled 2019-05-08: qty 1

## 2019-05-08 MED ORDER — ALBUTEROL SULFATE HFA 108 (90 BASE) MCG/ACT IN AERS
2.0000 | INHALATION_SPRAY | Freq: Four times a day (QID) | RESPIRATORY_TRACT | Status: DC | PRN
  Filled 2019-05-08: qty 6.7

## 2019-05-08 MED ORDER — SODIUM CHLORIDE 0.9 % IV BOLUS
500.0000 mL | Freq: Once | INTRAVENOUS | Status: AC
  Administered 2019-05-08: 500 mL via INTRAVENOUS

## 2019-05-08 MED ORDER — ENOXAPARIN SODIUM 40 MG/0.4ML ~~LOC~~ SOLN
40.0000 mg | SUBCUTANEOUS | Status: DC
  Administered 2019-05-08: 20:00:00 40 mg via SUBCUTANEOUS
  Filled 2019-05-08: qty 0.4

## 2019-05-08 MED ORDER — ACETAMINOPHEN 500 MG PO TABS: 1000.0000 mg | ORAL_TABLET | Freq: Three times a day (TID) | ORAL | Status: DC | PRN

## 2019-05-08 MED ORDER — CARVEDILOL 3.125 MG PO TABS
3.1250 mg | ORAL_TABLET | Freq: Two times a day (BID) | ORAL | Status: DC
  Administered 2019-05-08 – 2019-05-09 (×3): 3.125 mg via ORAL
  Filled 2019-05-08 (×4): qty 1

## 2019-05-08 MED ORDER — DEXAMETHASONE SODIUM PHOSPHATE 10 MG/ML IJ SOLN
6.0000 mg | Freq: Once | INTRAMUSCULAR | Status: AC
  Administered 2019-05-08: 6 mg via INTRAVENOUS
  Filled 2019-05-08: qty 1

## 2019-05-08 MED ORDER — ACETAMINOPHEN 500 MG PO TABS
1000.0000 mg | ORAL_TABLET | Freq: Once | ORAL | Status: AC
  Administered 2019-05-08: 1000 mg via ORAL
  Filled 2019-05-08: qty 2

## 2019-05-08 MED ORDER — LOSARTAN POTASSIUM 50 MG PO TABS
100.0000 mg | ORAL_TABLET | Freq: Every day | ORAL | Status: DC
  Administered 2019-05-08 – 2019-05-09 (×2): 100 mg via ORAL
  Filled 2019-05-08 (×2): qty 2

## 2019-05-08 MED ORDER — ALLOPURINOL 100 MG PO TABS
300.0000 mg | ORAL_TABLET | Freq: Every day | ORAL | Status: DC
  Administered 2019-05-08: 21:00:00 300 mg via ORAL
  Filled 2019-05-08: qty 3
  Filled 2019-05-08: qty 1

## 2019-05-08 MED ORDER — ZINC SULFATE 220 (50 ZN) MG PO CAPS
220.0000 mg | ORAL_CAPSULE | Freq: Every day | ORAL | Status: DC
  Administered 2019-05-08 – 2019-05-09 (×2): 220 mg via ORAL
  Filled 2019-05-08 (×2): qty 1

## 2019-05-08 MED ORDER — GUAIFENESIN-DM 100-10 MG/5ML PO SYRP
10.0000 mL | ORAL_SOLUTION | ORAL | Status: DC | PRN
  Administered 2019-05-08: 21:00:00 10 mL via ORAL
  Filled 2019-05-08 (×2): qty 10

## 2019-05-08 MED ORDER — IOHEXOL 350 MG/ML SOLN
100.0000 mL | Freq: Once | INTRAVENOUS | Status: AC | PRN
  Administered 2019-05-08: 100 mL via INTRAVENOUS

## 2019-05-08 MED ORDER — DEXAMETHASONE SODIUM PHOSPHATE 10 MG/ML IJ SOLN
6.0000 mg | INTRAMUSCULAR | Status: DC
  Administered 2019-05-08: 6 mg via INTRAVENOUS
  Filled 2019-05-08: qty 1

## 2019-05-08 MED ORDER — PNEUMOCOCCAL VAC POLYVALENT 25 MCG/0.5ML IJ INJ
0.5000 mL | INJECTION | INTRAMUSCULAR | Status: DC
  Filled 2019-05-08: qty 0.5

## 2019-05-08 MED ORDER — SODIUM CHLORIDE 0.9 % IV SOLN
200.0000 mg | Freq: Once | INTRAVENOUS | Status: AC
  Administered 2019-05-08: 200 mg via INTRAVENOUS
  Filled 2019-05-08: qty 200

## 2019-05-08 NOTE — Assessment & Plan Note (Signed)
Pt continued on home regimen of allopurinol.

## 2019-05-08 NOTE — ED Notes (Signed)
Attempted to call report, per charge nurse on 1 C, room not ready.

## 2019-05-08 NOTE — Assessment & Plan Note (Signed)
Home regimen with BP meds continued.

## 2019-05-08 NOTE — H&P (Signed)
History and Physical    Angelica Barnett [id number] DOB: Aug 19, 1950 DOA: 05/08/2019    PCP: Dion Body, MD   Patient coming from: HOME   Chief Complaint: Acute hypoxic respiratory failure due to Covid-19 PNA.   HPI: Angelica Barnett is a 69 y.o. female with medical history significant of htn/ gout seen in exd for sob and cough and fever and diarrhea x 1 day started since last Sunday and went to walk in and found to have covid-19 .spouse has covid as well. Angelica Barnett is a 69 y.o. female with anemia, hypertension, positive Covid on 1/17 who comes in for shortness of breath.  Patient states that she is been having shortness of breath has been gradually worsening over the past few days.  She has been treated with some antibiotics, dexamethasone was recently started 3 days ago, cough syrup.  She continues to have a cough, fevers and shortness of breath.  Shortness of breath has become more severe, intermittent, worse with exertion, better at rest.  She states that her oxygen levels were 84% at home which is why she presented to the ER.     ED Course: Blood pressure (!) 141/80, pulse 83, temperature 100.2 F (37.9 C), temperature source Oral, resp. rate 17, height 5\' 5"  (1.651 m), weight 112.6 kg, SpO2 95 %.    Review of Systems: As per HPI otherwise 10 point review of systems negative.     Past Medical History:  Diagnosis Date  . Anemia    H/O WITH C/S  . Arthritis   . Family history of adverse reaction to anesthesia    PT MOM-N/V  . GERD (gastroesophageal reflux disease)    NO MEDS  . Gout   . Headache    H/O MIGRAINES  . Heart murmur   . Hypertension   . Insomnia   . Obesity     Past Surgical History:  Procedure Laterality Date  . BILATERAL CARPAL TUNNEL RELEASE    . CESAREAN SECTION     X2  . COLONOSCOPY    . COLONOSCOPY WITH PROPOFOL N/A 12/08/2016   Procedure: COLONOSCOPY WITH PROPOFOL;  Surgeon: Lollie Sails, MD;  Location: [facility] ENDOSCOPY;   Service: Endoscopy;  Laterality: N/A;  . EYE SURGERY Bilateral    CATARACT  . KNEE ARTHROSCOPY WITH MENISCAL REPAIR Left 02/14/2016   Procedure: KNEE ARTHROSCOPY WITH MENISCAL REPAIR,debridement;  Surgeon: Corky Mull, MD;  Location: [facility] ORS;  Service: Orthopedics;  Laterality: Left;     reports that she has never smoked. She has never used smokeless tobacco. She reports current alcohol use. She reports that she does not use drugs.  Allergies  Allergen Reactions  . Celecoxib Hives, Swelling and Anaphylaxis    Family History  Problem Relation Age of Onset  . Breast cancer Sister 28    Prior to Admission medications   Medication Sig Start Date End Date Taking? Authorizing Provider  acetaminophen (TYLENOL) 500 MG tablet Take 1,000 mg by mouth every 8 (eight) hours as needed for mild pain.   Yes [provider]  allopurinol (ZYLOPRIM) 300 MG tablet Take 300 mg by mouth daily. 02/21/19  Yes [provider]  Calcium Carbonate-Vitamin D (CALCIUM-VITAMIN D) 500-200 MG-UNIT tablet Take 1 tablet by mouth daily.   Yes [provider]  carvedilol (COREG) 3.125 MG tablet Take 3.125 mg by mouth 2 (two) times daily. 04/21/19  Yes [provider]  cefdinir (OMNICEF) 300 MG capsule Take 300 mg by mouth 2 (  two) times daily. For 10 days 05/01/19 05/11/19 Yes [provider]  cetirizine (ZYRTEC) 10 MG tablet Take 10 mg by mouth at bedtime.   Yes [provider]  chlorpheniramine-HYDROcodone (TUSSIONEX) 10-8 MG/5ML SUER Take 5 mLs by mouth every evening. 05/06/19 05/16/19 Yes [provider]  dexamethasone (DECADRON) 6 MG tablet Take 6 mg by mouth daily with breakfast. 05/06/19 05/16/19 Yes [provider]  ibuprofen (ADVIL,MOTRIN) 200 MG tablet Take 400-600 mg by mouth at bedtime. AND PRN    Yes [provider]  losartan (COZAAR) 100 MG tablet Take 100 mg by mouth daily. 03/21/19  Yes [provider]  indomethacin (INDOCIN) 50  MG capsule Take 50 mg by mouth as needed for mild pain or moderate pain.     [provider]    Physical Exam: Vitals:   05/08/19 1415 05/08/19 1430 05/08/19 1445 05/08/19 1619  BP:  (!) 141/80    Pulse: 90 81 83   Resp: (!) 25 18 17    Temp:      TempSrc:      SpO2: 94% 90% 95%   Weight:    112.6 kg  Height:    5\' 5"  (1.651 m)      Constitutional: NAD, calm, comfortable Vitals:   05/08/19 1415 05/08/19 1430 05/08/19 1445 05/08/19 1619  BP:  (!) 141/80    Pulse: 90 81 83   Resp: (!) 25 18 17    Temp:      TempSrc:      SpO2: 94% 90% 95%   Weight:    112.6 kg  Height:    5\' 5"  (1.651 m)   Eyes: PERRL, lids and conjunctivae normal ENMT: Mucous membranes are moist. Posterior pharynx clear of any exudate or lesions.Normal dentition.  Neck: normal, supple, no masses, no thyromegaly Respiratory: clear to auscultation bilaterally, no wheezing, no crackles. Normal respiratory effort. No accessory muscle use.  Cardiovascular: Regular rate and rhythm, no murmurs / rubs / gallops. No extremity edema. 2+ pedal pulses. No carotid bruits.  Abdomen: no tenderness, no masses palpated. No hepatosplenomegaly. Bowel sounds positive.  Musculoskeletal: no clubbing / cyanosis. No joint deformity upper and lower extremities. Good ROM, no contractures. Normal muscle tone.  Skin: no rashes, lesions, ulcers. No induration Neurologic: CN 2-12 grossly intact. Sensation intact, DTR normal. Strength 5/5 in all 4.  Psychiatric: Normal judgment and insight. Alert and oriented x 3. Normal mood.    Labs on Admission: I have personally reviewed following labs and imaging studies  CBC: Recent Labs  Lab 05/08/19 0911  WBC 7.0  NEUTROABS 5.8  HGB 14.2  HCT 42.2  MCV 94.4  PLT [id number]   Basic Metabolic Panel: Recent Labs  Lab 05/08/19 1016  NA 136  K 3.8  CL 96*  CO2 28  GLUCOSE 104*  BUN 11  CREATININE 0.93  CALCIUM 8.6*   GFR: Estimated Creatinine Clearance: 70.4 mL/min (by C-G  formula based on SCr of 0.93 mg/dL). Liver Function Tests: Recent Labs  Lab 05/08/19 1016  AST 39  ALT 29  ALKPHOS 75  BILITOT 0.6  PROT 7.6  ALBUMIN 3.6   No results for input(s): LIPASE, AMYLASE in the last 168 hours. No results for input(s): AMMONIA in the last 168 hours. Coagulation Profile: No results for input(s): INR, PROTIME in the last 168 hours. Cardiac Enzymes: No results for input(s): CKTOTAL, CKMB, CKMBINDEX, TROPONINI in the last 168 hours. BNP (last 3 results) No results for input(s): PROBNP in the last 8760  hours. HbA1C: No results for input(s): HGBA1C in the last 72 hours. CBG: No results for input(s): GLUCAP in the last 168 hours. Lipid Profile: Recent Labs    05/08/19 1016  TRIG 142   Thyroid Function Tests: No results for input(s): TSH, T4TOTAL, FREET4, T3FREE, THYROIDAB in the last 72 hours. Anemia Panel: Recent Labs    05/08/19 1016  FERRITIN 386*   Urine analysis: No results found for: COLORURINE, APPEARANCEUR, LABSPEC, PHURINE, GLUCOSEU, HGBUR, BILIRUBINUR, KETONESUR, PROTEINUR, UROBILINOGEN, NITRITE, LEUKOCYTESUR  )No results found for this or any previous visit (from the past 240 hour(s)).   Radiological Exams on Admission: DG Chest Port 1 View  Result Date: 05/08/2019 CLINICAL DATA:  Shortness of breath, COVID-19 positive EXAM: PORTABLE CHEST 1 VIEW COMPARISON:  None. FINDINGS: Bilateral interstitial and patchy alveolar airspace opacities. No pleural effusion or pneumothorax. No cardiomediastinal abnormality. No acute osseous abnormality. IMPRESSION: Bilateral interstitial and patchy alveolar airspace opacities concerning for multilobar pneumonia including atypical viral pneumonia. Electronically Signed   By: Kathreen Devoid   On: 05/08/2019 09:29    EKG: Independently reviewed. .sinsu tach 107 st depression in lead III and twi in V1.  Assessment/Plan Principal Problem:   Acute respiratory failure due to COVID-19 [facility]) Active Problems:    Pneumonia due to COVID-19 virus   Hypertension   GERD (gastroesophageal reflux disease)   Gout   Abnormal EKG   Hypertension Assessment & Plan Home regimen with BP meds continued.    Acute respiratory failure due to COVID-19 [facility]) Assessment & Plan Pt started on covid-19 isolation. remdesivir and decadron day 1. Vit c / zinc. Oxygen for goal at of 93 and above. cta to identify PE.   Abnormal EKG Assessment & Plan Pt has 37mm st depression and twi on comparison to previous ekg . Pt denies any chest pain.  1St Troponin is negative.  We will cycle troponin. 2 d echo / CTA.   Gout Assessment & Plan Pt continued on home regimen of allopurinol.    DVT prophylaxis: lovenox  Code Status: Full  Family Communication: alijandra spiva [id number] kim Ronnald Ramp [phone] Disposition Plan: Home Consults called: None Admission status: Inpatient.   Para Skeans MD [facility] If 7PM-7AM, please contact night-coverage [email or web address] Password [facility]  05/08/2019, 4:46 PM

## 2019-05-08 NOTE — ED Notes (Signed)
Attempted to call report to 1 C, RN not available, will call back

## 2019-05-08 NOTE — Assessment & Plan Note (Signed)
Pt started on covid-19 isolation. remdesivir and decadron day 1. Vit c / zinc. Oxygen for goal at of 93 and above. cta to identify PE.

## 2019-05-08 NOTE — ED Provider Notes (Signed)
[facility] Emergency Department Provider Note  ____________________________________________   First MD Initiated Contact with Patient 05/08/19 [phone]     (approximate)  I have reviewed the triage vital signs and the nursing notes.   HISTORY  Chief Complaint Shortness of Breath    HPI Angelica Barnett is a 69 y.o. female with anemia, hypertension, positive Covid on 1/17 who comes in for shortness of breath.  Patient states that she is been having shortness of breath has been gradually worsening over the past few days.  She has been treated with some antibiotics, dexamethasone was recently started 3 days ago, cough syrup.  She continues to have a cough, fevers and shortness of breath.  Shortness of breath has become more severe, intermittent, worse with exertion, better at rest.  She states that her oxygen levels were 84% at home which is why she presented to the ER.          Past Medical History:  Diagnosis Date  . Anemia    H/O WITH C/S  . Arthritis   . Family history of adverse reaction to anesthesia    PT MOM-N/V  . GERD (gastroesophageal reflux disease)    NO MEDS  . Gout   . Headache    H/O MIGRAINES  . Heart murmur   . Hypertension   . Insomnia   . Obesity     There are no problems to display for this patient.   Past Surgical History:  Procedure Laterality Date  . BILATERAL CARPAL TUNNEL RELEASE    . CESAREAN SECTION     X2  . COLONOSCOPY    . COLONOSCOPY WITH PROPOFOL N/A 12/08/2016   Procedure: COLONOSCOPY WITH PROPOFOL;  Surgeon: Lollie Sails, MD;  Location: [facility] ENDOSCOPY;  Service: Endoscopy;  Laterality: N/A;  . EYE SURGERY Bilateral    CATARACT  . KNEE ARTHROSCOPY WITH MENISCAL REPAIR Left 02/14/2016   Procedure: KNEE ARTHROSCOPY WITH MENISCAL REPAIR,debridement;  Surgeon: Corky Mull, MD;  Location: [facility] ORS;  Service: Orthopedics;  Laterality: Left;    Prior to Admission medications   Medication Sig Start Date End  Date Taking? Authorizing Provider  acetaminophen (TYLENOL) 500 MG tablet Take 1,000 mg by mouth every 8 (eight) hours as needed for mild pain.    [provider]  allopurinol (ZYLOPRIM) 100 MG tablet Take 200 mg by mouth at bedtime.     [provider]  Calcium Carbonate-Vitamin D (CALCIUM-VITAMIN D) 500-200 MG-UNIT tablet Take 1 tablet by mouth daily.    [provider]  cetirizine (ZYRTEC) 10 MG tablet Take 10 mg by mouth at bedtime.    [provider]  HYDROcodone-acetaminophen (NORCO) 5-325 MG tablet Take 1-2 tablets by mouth every 6 (six) hours as needed for moderate pain. MAXIMUM TOTAL ACETAMINOPHEN DOSE IS 4000 MG PER DAY Patient not taking: Reported on 12/08/2016 02/14/16   Poggi, Marshall Cork, MD  ibuprofen (ADVIL,MOTRIN) 200 MG tablet Take 400-600 mg by mouth at bedtime. AND PRN     [provider]  indomethacin (INDOCIN) 50 MG capsule Take 50 mg by mouth as needed for mild pain or moderate pain.     [provider]  losartan (COZAAR) 50 MG tablet Take 50 mg by mouth daily.    [provider]  triamterene-hydrochlorothiazide (MAXZIDE-25) 37.5-25 MG tablet Take 1 tablet by mouth every morning.    [provider]    Allergies Celecoxib  Family History  Problem Relation Age of Onset  .  Breast cancer Sister 53    Social History Social History   Tobacco Use  . Smoking status: Never Smoker  . Smokeless tobacco: Never Used  Substance Use Topics  . Alcohol use: Yes    Comment: RARE  . Drug use: No      Review of Systems Constitutional: Positive for fever Eyes: No visual changes. ENT: No sore throat. Cardiovascular: No chest pain Respiratory: Positive for SOB, cough Gastrointestinal: No abdominal pain.  No nausea, no vomiting.  No diarrhea.  No constipation. Genitourinary: Negative for dysuria. Musculoskeletal: Negative for back pain. Skin: Negative for rash. Neurological: Negative for headaches, focal  weakness or numbness. All other ROS negative ____________________________________________   PHYSICAL EXAM:  VITAL SIGNS: Blood pressure (!) 152/91, pulse (!) 111, temperature (!) 103.2 F (39.6 C), SpO2 95 %.   Constitutional: Alert and oriented. Well appearing and in no acute distress. Eyes: Conjunctivae are normal. EOMI. Head: Atraumatic. Nose: No congestion/rhinnorhea. Mouth/Throat: Mucous membranes are moist.   Neck: No stridor. Trachea Midline. FROM Cardiovascular: Tachycardic, regular rhythm. Grossly normal heart sounds.  Good peripheral circulation. Respiratory: Patient satting 88 to 89% on room air so placed on 2 L.  No increased work of breathing.  Occasionally coughing. Gastrointestinal: Soft and nontender. No distention. No abdominal bruits.  Musculoskeletal: No lower extremity tenderness nor edema.  No joint effusions. Neurologic:  Normal speech and language. No gross focal neurologic deficits are appreciated.  Skin:  Skin is warm, dry and intact. No rash noted. Psychiatric: Mood and affect are normal. Speech and behavior are normal. GU: Deferred   ____________________________________________   LABS (all labs ordered are listed, but only abnormal results are displayed)  Labs Reviewed  FIBRIN DERIVATIVES D-DIMER ([facility] ONLY) - Abnormal; Notable for the following components:      Result Value   Fibrin derivatives D-dimer ([facility]) 824.62 (*)    All other components within normal limits  FIBRINOGEN - Abnormal; Notable for the following components:   Fibrinogen 505 (*)    All other components within normal limits  COMPREHENSIVE METABOLIC PANEL - Abnormal; Notable for the following components:   Chloride 96 (*)    Glucose, Bld 104 (*)    Calcium 8.6 (*)    All other components within normal limits  FERRITIN - Abnormal; Notable for the following components:   Ferritin 386 (*)    All other components within normal limits  LACTATE DEHYDROGENASE - Abnormal; Notable for  the following components:   LDH 262 (*)    All other components within normal limits  CULTURE, BLOOD (ROUTINE X 2)  CULTURE, BLOOD (ROUTINE X 2)  LACTIC ACID, PLASMA  CBC WITH DIFFERENTIAL/PLATELET  BRAIN NATRIURETIC PEPTIDE  TRIGLYCERIDES  LACTIC ACID, PLASMA  C-REACTIVE PROTEIN  PROCALCITONIN  TROPONIN I (HIGH SENSITIVITY)   ____________________________________________   ED ECG REPORT I, Vanessa Seguin, the attending physician, personally viewed and interpreted this ECG.  EKG sinus tachycardia rate of 107, no ST elevation, no T wave inversions, normal intervals ____________________________________________  RADIOLOGY Robert Bellow, personally viewed and evaluated these images (plain radiographs) as part of my medical decision making, as well as reviewing the written report by the radiologist.  ED MD interpretation: Multifocal opacities concerning for her Covid  Official radiology report(s): DG Chest Port 1 View  Result Date: 05/08/2019 CLINICAL DATA:  Shortness of breath, COVID-19 positive EXAM: PORTABLE CHEST 1 VIEW COMPARISON:  None. FINDINGS: Bilateral interstitial and patchy alveolar airspace opacities. No pleural effusion or pneumothorax. No cardiomediastinal abnormality.  No acute osseous abnormality. IMPRESSION: Bilateral interstitial and patchy alveolar airspace opacities concerning for multilobar pneumonia including atypical viral pneumonia. Electronically Signed   By: Kathreen Devoid   On: 05/08/2019 09:29    ____________________________________________   PROCEDURES  Procedure(s) performed (including Critical Care):  .Critical Care Performed by: Vanessa [place], MD Authorized by: Vanessa [place], MD   Critical care provider statement:    Critical care time (minutes):  35   Critical care was necessary to treat or prevent imminent or life-threatening deterioration of the following conditions:  Respiratory failure   Critical care was time spent personally by me on the  following activities:  Discussions with consultants, evaluation of patient's response to treatment, examination of patient, ordering and performing treatments and interventions, ordering and review of laboratory studies, ordering and review of radiographic studies, pulse oximetry, re-evaluation of patient's condition, obtaining history from patient or surrogate and review of old charts     ____________________________________________   INITIAL IMPRESSION / ASSESSMENT AND PLAN / ED COURSE   Angelica Barnett was evaluated in Emergency Department on 05/08/2019 for the symptoms described in the history of present illness. She was evaluated in the context of the global COVID-19 pandemic, which necessitated consideration that the patient might be at risk for infection with the SARS-CoV-2 virus that causes COVID-19. Institutional protocols and algorithms that pertain to the evaluation of patients at risk for COVID-19 are in a state of rapid change based on information released by regulatory bodies including the CDC and federal and state organizations. These policies and algorithms were followed during the patient's care in the ED.     Pt presents with SOB.  Most likely secondary to coronavirus.  Will get basic labs.  Given patient is hypoxic will give Decadron and placed on 2 L cleared will discuss with the hospital team for admission  PNA-will get xray to evaluation Anemia-CBC to evaluate ACS- will get trops Arrhythmia-Will get EKG and keep on monitor.  PE-lower suspicion given no risk factors and other cause more likely  No evidence of renal failure.  White count is normal making bacterial less likely.  Will wait for procalcitonin.  If negative will hold off on antibiotics.  We will discuss with the hospital team for admission   ____________________________________________   FINAL CLINICAL IMPRESSION(S) / ED DIAGNOSES   Final diagnoses:  COVID-19  Acute respiratory failure with hypoxia  ([place])     MEDICATIONS GIVEN DURING THIS VISIT:  Medications  acetaminophen (TYLENOL) tablet 1,000 mg (has no administration in time range)  sodium chloride 0.9 % bolus 500 mL (has no administration in time range)  dexamethasone (DECADRON) injection 6 mg (has no administration in time range)     ED Discharge Orders    None       Note:  This document was prepared using Dragon voice recognition software and may include unintentional dictation errors.   Vanessa Swifton, MD 05/08/19 1139

## 2019-05-08 NOTE — Plan of Care (Signed)
  Problem: Education: Goal: Knowledge of risk factors and measures for prevention of condition will improve Outcome: Progressing   

## 2019-05-08 NOTE — Progress Notes (Addendum)
Remdesivir - Pharmacy Brief Note  Positive COVID test on 1/17   O:  ALT: 29 CXR: Bilateral interstitial and patchy alveolar airspace opacities concerning for multilobar pneumonia including atypical viral pneumonia. SpO2: 84% on room air    A/P:  Remdesivir 200 mg IVPB once followed by 100 mg IVPB daily x 4 days.   Pernell Dupre, PharmD, BCPS Clinical Pharmacist 05/08/2019 12:48 PM

## 2019-05-08 NOTE — Assessment & Plan Note (Signed)
Pt has 55mm st depression and twi on comparison to previous ekg . Pt denies any chest pain.  1St Troponin is negative.  We will cycle troponin. 2 d echo / CTA.

## 2019-05-08 NOTE — ED Notes (Signed)
Pt to toilet with O2, RR increased to 33 rpm, O2 sat dropped to 88 even after return to bed. O2 increased to 3 L [place], O2 sat 95-97 % on 3 Lnc

## 2019-05-08 NOTE — ED Triage Notes (Signed)
Pt to ED by EMS from home with c/o of SOB and cough. Pt is COVID +. Pt denies NVD. Pt is febrile at this time 103.2.

## 2019-05-09 ENCOUNTER — Inpatient Hospital Stay (HOSPITAL_COMMUNITY)
Admission: AD | Admit: 2019-05-09 | Discharge: 2019-05-19 | DRG: 177 | Disposition: A | Discharge status: Home / Self Care | Payer: Medicare Other | Source: Other Acute Inpatient Hospital | Attending: Internal Medicine | Admitting: Internal Medicine

## 2019-05-09 ENCOUNTER — Inpatient Hospital Stay
Admit: 2019-05-09 | Discharge: 2019-05-09 | Disposition: A | Discharge status: Home / Self Care | Payer: Medicare Other | Attending: Internal Medicine | Admitting: Internal Medicine

## 2019-05-09 ENCOUNTER — Inpatient Hospital Stay (HOSPITAL_COMMUNITY): Payer: Medicare Other

## 2019-05-09 ENCOUNTER — Encounter (HOSPITAL_COMMUNITY): Payer: Self-pay | Admitting: Family Medicine

## 2019-05-09 DIAGNOSIS — Z79899 Other long term (current) drug therapy: Secondary | ICD-10-CM | POA: Diagnosis not present

## 2019-05-09 DIAGNOSIS — K2100 Gastro-esophageal reflux disease with esophagitis, without bleeding: Secondary | ICD-10-CM | POA: Diagnosis not present

## 2019-05-09 DIAGNOSIS — Z888 Allergy status to other drugs, medicaments and biological substances status: Secondary | ICD-10-CM

## 2019-05-09 DIAGNOSIS — A419 Sepsis, unspecified organism: Secondary | ICD-10-CM | POA: Diagnosis not present

## 2019-05-09 DIAGNOSIS — J9600 Acute respiratory failure, unspecified whether with hypoxia or hypercapnia: Secondary | ICD-10-CM | POA: Diagnosis not present

## 2019-05-09 DIAGNOSIS — M1A9XX0 Chronic gout, unspecified, without tophus (tophi): Secondary | ICD-10-CM

## 2019-05-09 DIAGNOSIS — R9431 Abnormal electrocardiogram [ECG] [EKG]: Secondary | ICD-10-CM

## 2019-05-09 DIAGNOSIS — R0600 Dyspnea, unspecified: Secondary | ICD-10-CM

## 2019-05-09 DIAGNOSIS — M109 Gout, unspecified: Secondary | ICD-10-CM | POA: Diagnosis present

## 2019-05-09 DIAGNOSIS — J9601 Acute respiratory failure with hypoxia: Secondary | ICD-10-CM | POA: Diagnosis present

## 2019-05-09 DIAGNOSIS — Z6841 Body Mass Index (BMI) 40.0 and over, adult: Secondary | ICD-10-CM

## 2019-05-09 DIAGNOSIS — U071 COVID-19: Secondary | ICD-10-CM | POA: Diagnosis present

## 2019-05-09 DIAGNOSIS — M1000 Idiopathic gout, unspecified site: Secondary | ICD-10-CM | POA: Diagnosis not present

## 2019-05-09 DIAGNOSIS — J1282 Pneumonia due to coronavirus disease 2019: Secondary | ICD-10-CM

## 2019-05-09 DIAGNOSIS — I10 Essential (primary) hypertension: Secondary | ICD-10-CM

## 2019-05-09 DIAGNOSIS — G4700 Insomnia, unspecified: Secondary | ICD-10-CM | POA: Diagnosis present

## 2019-05-09 DIAGNOSIS — D649 Anemia, unspecified: Secondary | ICD-10-CM | POA: Diagnosis present

## 2019-05-09 DIAGNOSIS — K219 Gastro-esophageal reflux disease without esophagitis: Secondary | ICD-10-CM | POA: Diagnosis present

## 2019-05-09 DIAGNOSIS — R0602 Shortness of breath: Secondary | ICD-10-CM

## 2019-05-09 LAB — COMPREHENSIVE METABOLIC PANEL
ALT: 29 U/L (ref 0–44)
AST: 36 U/L (ref 15–41)
Albumin: 3.4 g/dL — ABNORMAL LOW (ref 3.5–5.0)
Alkaline Phosphatase: 67 U/L (ref 38–126)
Anion gap: 10 (ref 5–15)
BUN: 12 mg/dL (ref 8–23)
CO2: 26 mmol/L (ref 22–32)
Calcium: 8.3 mg/dL — ABNORMAL LOW (ref 8.9–10.3)
Chloride: 100 mmol/L (ref 98–111)
Creatinine, Ser: 0.79 mg/dL (ref 0.44–1.00)
GFR calc Af Amer: >60 mL/min (ref >60)
GFR calc non Af Amer: >60 mL/min (ref >60)
Glucose, Bld: 114 mg/dL — ABNORMAL HIGH (ref 70–99)
Potassium: 3.7 mmol/L (ref 3.5–5.1)
Sodium: 136 mmol/L (ref 135–145)
Total Bilirubin: 0.8 mg/dL (ref 0.3–1.2)
Total Protein: 7.1 g/dL (ref 6.5–8.1)

## 2019-05-09 LAB — ECHOCARDIOGRAM LIMITED
Height: 65 in
Weight: 3971.81 oz

## 2019-05-09 LAB — CBC WITH DIFFERENTIAL/PLATELET
Abs Immature Granulocytes: 0.07 10*3/uL (ref 0.00–0.07)
Basophils Absolute: 0 10*3/uL (ref 0.0–0.1)
Basophils Relative: 0 %
Eosinophils Absolute: 0 10*3/uL (ref 0.0–0.5)
Eosinophils Relative: 0 %
HCT: 40.1 % (ref 36.0–46.0)
Hemoglobin: 13.3 g/dL (ref 12.0–15.0)
Immature Granulocytes: 1 %
Lymphocytes Relative: 7 %
Lymphs Abs: 0.6 10*3/uL — ABNORMAL LOW (ref 0.7–4.0)
MCH: 31 pg (ref 26.0–34.0)
MCHC: 33.2 g/dL (ref 30.0–36.0)
MCV: 93.5 fL (ref 80.0–100.0)
Monocytes Absolute: 0.6 10*3/uL (ref 0.1–1.0)
Monocytes Relative: 8 %
Neutro Abs: 6.6 10*3/uL (ref 1.7–7.7)
Neutrophils Relative %: 84 %
Platelets: 240 10*3/uL (ref 150–400)
RBC: 4.29 MIL/uL (ref 3.87–5.11)
RDW: 12.7 % (ref 11.5–15.5)
WBC: 7.8 10*3/uL (ref 4.0–10.5)
nRBC: 0 % (ref 0.0–0.2)

## 2019-05-09 LAB — C-REACTIVE PROTEIN: CRP: 13.8 mg/dL — ABNORMAL HIGH (ref <1.0)

## 2019-05-09 LAB — FERRITIN: Ferritin: 364 ng/mL — ABNORMAL HIGH (ref 11–307)

## 2019-05-09 LAB — FIBRIN DERIVATIVES D-DIMER (ARMC ONLY): Fibrin derivatives D-dimer (ARMC): 768.33 ng/mL (FEU) — ABNORMAL HIGH (ref 0.00–499.00)

## 2019-05-09 MED ORDER — METHYLPREDNISOLONE SODIUM SUCC 125 MG IJ SOLR
60.0000 mg | Freq: Three times a day (TID) | INTRAMUSCULAR | Status: DC
  Administered 2019-05-09 – 2019-05-13 (×12): 60 mg via INTRAVENOUS
  Filled 2019-05-09 (×11): qty 2

## 2019-05-09 MED ORDER — ONDANSETRON HCL 4 MG PO TABS: 4.0000 mg | ORAL_TABLET | Freq: Four times a day (QID) | ORAL | Status: DC | PRN

## 2019-05-09 MED ORDER — DEXAMETHASONE SODIUM PHOSPHATE 10 MG/ML IJ SOLN: 6.0000 mg | INTRAMUSCULAR | 0 refills | Status: DC

## 2019-05-09 MED ORDER — ASCORBIC ACID 500 MG PO TABS: 500.0000 mg | ORAL_TABLET | Freq: Every day | ORAL | 0 refills | Status: DC

## 2019-05-09 MED ORDER — TOCILIZUMAB 400 MG/20ML IV SOLN
800.0000 mg | Freq: Once | INTRAVENOUS | Status: AC
  Administered 2019-05-09: 15:00:00 800 mg via INTRAVENOUS
  Filled 2019-05-09: qty 40

## 2019-05-09 MED ORDER — SENNOSIDES-DOCUSATE SODIUM 8.6-50 MG PO TABS: 1.0000 | ORAL_TABLET | Freq: Every evening | ORAL | Status: DC | PRN

## 2019-05-09 MED ORDER — ZINC SULFATE 220 (50 ZN) MG PO CAPS
220.0000 mg | ORAL_CAPSULE | Freq: Every day | ORAL | Status: DC
  Administered 2019-05-10 – 2019-05-19 (×10): 220 mg via ORAL
  Filled 2019-05-09 (×10): qty 1

## 2019-05-09 MED ORDER — INDOMETHACIN 50 MG PO CAPS
50.0000 mg | ORAL_CAPSULE | ORAL | Status: DC | PRN
  Filled 2019-05-09: qty 1

## 2019-05-09 MED ORDER — ALBUTEROL SULFATE HFA 108 (90 BASE) MCG/ACT IN AERS: 2.0000 | INHALATION_SPRAY | Freq: Four times a day (QID) | RESPIRATORY_TRACT | 0 refills | Status: AC | PRN

## 2019-05-09 MED ORDER — HYDROCOD POLST-CPM POLST ER 10-8 MG/5ML PO SUER
5.0000 mL | Freq: Two times a day (BID) | ORAL | Status: DC | PRN
  Administered 2019-05-10 – 2019-05-16 (×6): 5 mL via ORAL
  Filled 2019-05-09 (×7): qty 5

## 2019-05-09 MED ORDER — GUAIFENESIN-DM 100-10 MG/5ML PO SYRP: 10.0000 mL | ORAL_SOLUTION | ORAL | 0 refills | Status: DC | PRN

## 2019-05-09 MED ORDER — ZINC SULFATE 220 (50 ZN) MG PO CAPS: 220.0000 mg | ORAL_CAPSULE | Freq: Every day | ORAL | 0 refills | Status: DC

## 2019-05-09 MED ORDER — ALBUTEROL SULFATE HFA 108 (90 BASE) MCG/ACT IN AERS
2.0000 | INHALATION_SPRAY | Freq: Four times a day (QID) | RESPIRATORY_TRACT | Status: DC | PRN
  Filled 2019-05-09: qty 6.7

## 2019-05-09 MED ORDER — CALCIUM-VITAMIN D 500-200 MG-UNIT PO TABS
1.0000 | ORAL_TABLET | Freq: Every day | ORAL | Status: DC
  Administered 2019-05-10 – 2019-05-19 (×9): 1 via ORAL
  Filled 2019-05-09 (×10): qty 1

## 2019-05-09 MED ORDER — ENOXAPARIN SODIUM 40 MG/0.4ML ~~LOC~~ SOLN: 40.0000 mg | SUBCUTANEOUS | 0 refills | Status: DC

## 2019-05-09 MED ORDER — ACETAMINOPHEN 325 MG PO TABS
650.0000 mg | ORAL_TABLET | Freq: Four times a day (QID) | ORAL | Status: DC | PRN
  Administered 2019-05-09 – 2019-05-10 (×2): 650 mg via ORAL
  Filled 2019-05-09 (×2): qty 2

## 2019-05-09 MED ORDER — LOSARTAN POTASSIUM 25 MG PO TABS
100.0000 mg | ORAL_TABLET | Freq: Every day | ORAL | Status: DC
  Administered 2019-05-10 – 2019-05-19 (×10): 100 mg via ORAL
  Filled 2019-05-09 (×10): qty 4

## 2019-05-09 MED ORDER — ENOXAPARIN SODIUM 60 MG/0.6ML ~~LOC~~ SOLN
0.5000 mg/kg | Freq: Every day | SUBCUTANEOUS | Status: DC
  Administered 2019-05-09 – 2019-05-19 (×11): 55 mg via SUBCUTANEOUS
  Filled 2019-05-09 (×11): qty 0.6

## 2019-05-09 MED ORDER — CARVEDILOL 3.125 MG PO TABS
3.1250 mg | ORAL_TABLET | Freq: Two times a day (BID) | ORAL | Status: DC
  Administered 2019-05-09 – 2019-05-19 (×20): 3.125 mg via ORAL
  Filled 2019-05-09 (×20): qty 1

## 2019-05-09 MED ORDER — SODIUM CHLORIDE 0.9 % IV SOLN
100.0000 mg | Freq: Every day | INTRAVENOUS | Status: AC
  Administered 2019-05-10 – 2019-05-12 (×3): 100 mg via INTRAVENOUS
  Filled 2019-05-09 (×3): qty 20

## 2019-05-09 MED ORDER — GUAIFENESIN-DM 100-10 MG/5ML PO SYRP
10.0000 mL | ORAL_SOLUTION | ORAL | Status: DC | PRN
  Administered 2019-05-10 – 2019-05-15 (×4): 10 mL via ORAL
  Filled 2019-05-09 (×4): qty 10

## 2019-05-09 MED ORDER — SODIUM CHLORIDE 0.9 % IV SOLN: INTRAVENOUS | Status: DC

## 2019-05-09 MED ORDER — HYDROCOD POLST-CPM POLST ER 10-8 MG/5ML PO SUER: 5.0000 mL | Freq: Two times a day (BID) | ORAL | 0 refills | Status: DC | PRN

## 2019-05-09 MED ORDER — ALLOPURINOL 100 MG PO TABS
300.0000 mg | ORAL_TABLET | Freq: Every day | ORAL | Status: DC
  Administered 2019-05-10 – 2019-05-19 (×10): 300 mg via ORAL
  Filled 2019-05-09 (×10): qty 3

## 2019-05-09 MED ORDER — SODIUM CHLORIDE 0.9% FLUSH
3.0000 mL | Freq: Two times a day (BID) | INTRAVENOUS | Status: DC
  Administered 2019-05-09 – 2019-05-19 (×17): 3 mL via INTRAVENOUS

## 2019-05-09 MED ORDER — LORATADINE 10 MG PO TABS
10.0000 mg | ORAL_TABLET | Freq: Every day | ORAL | Status: DC
  Administered 2019-05-09 – 2019-05-19 (×11): 10 mg via ORAL
  Filled 2019-05-09 (×11): qty 1

## 2019-05-09 MED ORDER — ONDANSETRON HCL 4 MG/2ML IJ SOLN: 4.0000 mg | Freq: Four times a day (QID) | INTRAMUSCULAR | Status: DC | PRN

## 2019-05-09 MED ORDER — ASCORBIC ACID 500 MG PO TABS
500.0000 mg | ORAL_TABLET | Freq: Every day | ORAL | Status: DC
  Administered 2019-05-10 – 2019-05-19 (×10): 500 mg via ORAL
  Filled 2019-05-09 (×10): qty 1

## 2019-05-09 NOTE — Progress Notes (Signed)
*  PRELIMINARY RESULTS* Echocardiogram 2D Echocardiogram has been performed.  Angelica Barnett 05/09/2019, 10:54 AM

## 2019-05-09 NOTE — Discharge Summary (Signed)
Physician Discharge Summary  SMT STIDAM [id number] DOB: 03/16/1951 DOA: 05/08/2019  PCP: Dion Body, MD  Admit date: 05/08/2019 Discharge date: 05/09/2019  Admitted From: Home Disposition: [facility]  Recommendations for Outpatient Follow-up:  Transfer to [facility] on telemetry.  Please order Actemra once patient arrives.   Equipment/Devices: Oxygen (7 L via nasal cannula)  Discharge Condition: Guarded CODE STATUS: Full code Diet recommendation: Regular    Discharge Diagnoses:  Principal Problem:   Acute respiratory failure due to COVID-19 [facility])  Active Problems:   Hypertension   GERD (gastroesophageal reflux disease)   Gout   Pneumonia due to COVID-19 virus   Abnormal EKG  Brief narrative/HPI 69 y.o. female with medical history significant of htn/ gout seen in exd for sob and cough and fever and diarrhea x 1 day started since 1 week and went to walk in  clinic and found to have covid-19 (on 1/17).  Her spouse has covid as well. Patient states that she is been having shortness of breath has been gradually worsening over the past few days. She has been treated with some antibiotics, dexamethasone was recently started 3 days ago, cough syrup. She continues to have a cough, fevers and shortness of breath. Shortness of breath has become more severe, intermittent, worse with exertion, better at rest. She states that her oxygen levels were 84% at home which is why she presented to the ER. Patient admitted for acute hypoxic respiratory failure secondary to COVID-19 infection.  EKG done in the ED showed 1 mm ST depression.  A CT angiogram of the chest was negative for PE but showed multifocal pneumonia.   Hospital course Acute respiratory failure with hypoxia secondary to COVID-19 infection ([place]) Patient's hypoxia worsened this morning and requiring up to 7 L with O2 sat barely maintained between 90-92%.  Patient is tachypneic in the 30s.   Coughing up a lot of whitish phlegm, feels congested.  No hemoptysis.  Was febrile to 103 F overnight. Continue IV Decadron 6 mg daily, IV remdesivir.  Supportive care with antitussives, albuterol inhaler, vitamin C and zinc.  Her CRP has increased to 13 today. Given her respiratory decompensation requiring higher amount of oxygen, associated tachypnea on ongoing dyspnea with worsened inflammatory markers, she will be transferred to [facility] for further management.  Given her worsened hypoxia and elevated CRP she will be a good candidate for for IV Actemra.  Recommend administering once patient reaches [facility].  Monitor inflammatory markers daily.  Added IV fluids.  Active symptoms ST depression on EKG Admission EKG showed 1 mm ST depression.  No chest pain symptoms.  Troponins flat.  2D echo was ordered but can possibly done at [facility] if available.  Severe sepsis (HCC) Fever, tachycardia and tachypnea on presentation likely secondary to COVID-19.  Procalcitonin normal so I discontinued her antibiotics.  Follow blood cultures.  Continue fluids.  Essential hypertension Continue Coreg and losartan.  Chronic gout Continue allopurinol  DVT prophylaxis: Subcu Lovenox  Procedure: CT angiogram of the chest Disposition: [facility]  Family communication: We will update her husband  Discharge Instructions   Allergies as of 05/09/2019      Reactions   Celecoxib Hives, Swelling, Anaphylaxis      Medication List    STOP taking these medications   cefdinir 300 MG capsule Commonly known as: OMNICEF   dexamethasone 6 MG tablet Commonly known as: DECADRON   ibuprofen 200 MG tablet Commonly known as: ADVIL  TAKE these medications   acetaminophen 500 MG tablet Commonly known as: TYLENOL Take 1,000 mg by mouth every 8 (eight) hours as needed for mild pain.   albuterol 108 (90 Base) MCG/ACT inhaler Commonly known as: VENTOLIN HFA Inhale 2  puffs into the lungs every 6 (six) hours as needed for wheezing or shortness of breath.   allopurinol 300 MG tablet Commonly known as: ZYLOPRIM Take 300 mg by mouth daily.   ascorbic acid 500 MG tablet Commonly known as: VITAMIN C Take 1 tablet (500 mg total) by mouth daily. Start taking on: May 10, 2019   calcium-vitamin D 500-200 MG-UNIT tablet Take 1 tablet by mouth daily.   carvedilol 3.125 MG tablet Commonly known as: COREG Take 3.125 mg by mouth 2 (two) times daily.   cetirizine 10 MG tablet Commonly known as: ZYRTEC Take 10 mg by mouth at bedtime.   chlorpheniramine-HYDROcodone 10-8 MG/5ML Suer Commonly known as: TUSSIONEX Take 5 mLs by mouth every 12 (twelve) hours as needed for cough. What changed:   when to take this  reasons to take this   dexamethasone 10 MG/ML injection Commonly known as: DECADRON Inject 0.6 mLs (6 mg total) into the vein daily.   enoxaparin 40 MG/0.4ML injection Commonly known as: LOVENOX Inject 0.4 mLs (40 mg total) into the skin daily.   guaiFENesin-dextromethorphan 100-10 MG/5ML syrup Commonly known as: ROBITUSSIN DM Take 10 mLs by mouth every 4 (four) hours as needed for cough.   indomethacin 50 MG capsule Commonly known as: INDOCIN Take 50 mg by mouth as needed for mild pain or moderate pain.   losartan 100 MG tablet Commonly known as: COZAAR Take 100 mg by mouth daily.   zinc sulfate 220 (50 Zn) MG capsule Take 1 capsule (220 mg total) by mouth daily. Start taking on: May 10, 2019       Allergies  Allergen Reactions  . Celecoxib Hives, Swelling and Anaphylaxis      Procedures/Studies: CT ANGIO CHEST PE W OR WO CONTRAST  Result Date: 05/08/2019 CLINICAL DATA:  Shortness of breath, difficulty breathing EXAM: CT ANGIOGRAPHY CHEST WITH CONTRAST TECHNIQUE: Multidetector CT imaging of the chest was performed using the standard protocol during bolus administration of intravenous contrast. Multiplanar CT image  reconstructions and MIPs were obtained to evaluate the vascular anatomy. CONTRAST:  166mL OMNIPAQUE IOHEXOL 350 MG/ML SOLN COMPARISON:  None. FINDINGS: Cardiovascular: Satisfactory opacification of the pulmonary arteries to the segmental level. No evidence of pulmonary embolism. Mildly enlarged heart size. No pericardial effusion. Mediastinum/Nodes: No enlarged hilar, or axillary lymph nodes. Enlarged subcarinal lymph measuring 13 mm. Multiple small right paratracheal lymph nodes the largest measuring 9 mm. Thyroid gland, trachea, and esophagus demonstrate no significant findings. Lungs/Pleura: Bilateral patchy areas of ground-glass opacity throughout the lung fields. No pleural effusion or pneumothorax. No pneumomediastinum. Upper Abdomen: No acute abnormality. Musculoskeletal: No acute osseous abnormality. No aggressive osseous lesion. Review of the MIP images confirms the above findings. IMPRESSION: 1. No evidence of pulmonary embolus. 2. Bilateral patchy ground-glass opacities throughout the lungs as can be seen with atypical viral pneumonia given the patient's history of testing positive for COVID-19. Electronically Signed   By: Kathreen Devoid   On: 05/08/2019 16:59   DG Chest Port 1 View  Result Date: 05/08/2019 CLINICAL DATA:  Shortness of breath, COVID-19 positive EXAM: PORTABLE CHEST 1 VIEW COMPARISON:  None. FINDINGS: Bilateral interstitial and patchy alveolar airspace opacities. No pleural effusion or pneumothorax. No cardiomediastinal abnormality. No acute osseous abnormality. IMPRESSION: Bilateral  interstitial and patchy alveolar airspace opacities concerning for multilobar pneumonia including atypical viral pneumonia. Electronically Signed   By: Kathreen Devoid   On: 05/08/2019 09:29     Subjective: Feels very congested with productive cough and increasing shortness of breath.  Discharge Exam: Vitals:   05/09/19 0900 05/09/19 1000  BP:    Pulse: 100 90  Resp: 18 19  Temp:    SpO2: 90% 95%    Vitals:   05/09/19 0800 05/09/19 0804 05/09/19 0900 05/09/19 1000  BP:      Pulse: 86  100 90  Resp: (!) 22 20 18 19   Temp:  98.5 F (36.9 C)    TempSrc:  Oral    SpO2: 94%  90% 95%  Weight:      Height:        General: Elderly female in some distress with shortness of breath HEENT: Moist mucosa, supple neck Chest: Coarse breath sounds bilaterally CVs: S1-S2 tachycardic No murmurs GI: Soft, nondistended, nontender Musculoskeletal: Warm, no edema    The results of significant diagnostics from this hospitalization (including imaging, microbiology, ancillary and laboratory) are listed below for reference.     Microbiology: Recent Results (from the past 240 hour(s))  Blood Culture (routine x 2)     Status: None (Preliminary result)   Collection Time: 05/08/19  9:11 AM   Specimen: BLOOD  Result Value Ref Range Status   Specimen Description BLOOD BLOOD LEFT HAND  Final   Special Requests   Final    BOTTLES DRAWN AEROBIC AND ANAEROBIC Blood Culture adequate volume   Culture   Final    NO GROWTH < 24 HOURS Performed at [facility], [address] [place], [place] [address]    Report Status PENDING  Incomplete  Blood Culture (routine x 2)     Status: None (Preliminary result)   Collection Time: 05/08/19 10:16 AM   Specimen: BLOOD  Result Value Ref Range Status   Specimen Description BLOOD BLOOD LEFT HAND  Final   Special Requests   Final    BOTTLES DRAWN AEROBIC AND ANAEROBIC Blood Culture adequate volume   Culture   Final    NO GROWTH < 24 HOURS Performed at [facility], [address] [place], [place] [address]    Report Status PENDING  Incomplete     Labs: BNP (last 3 results) Recent Labs    05/08/19 0911  BNP [id number]   Basic Metabolic Panel: Recent Labs  Lab 05/08/19 1016 05/08/19 1710 05/09/19 0551  NA 136  --  136  K 3.8  --  3.7  CL 96*  --  100  CO2 28  --  26  GLUCOSE 104*  --  114*  BUN 11  --  12  CREATININE 0.93 0.78  0.79  CALCIUM 8.6*  --  8.3*   Liver Function Tests: Recent Labs  Lab 05/08/19 1016 05/09/19 0551  AST 39 36  ALT 29 29  ALKPHOS 75 67  BILITOT 0.6 0.8  PROT 7.6 7.1  ALBUMIN 3.6 3.4*   No results for input(s): LIPASE, AMYLASE in the last 168 hours. No results for input(s): AMMONIA in the last 168 hours. CBC: Recent Labs  Lab 05/08/19 0911 05/08/19 1710 05/09/19 0551  WBC 7.0 7.0 7.8  NEUTROABS 5.8  --  6.6  HGB 14.2 13.3 13.3  HCT 42.2 39.8 40.1  MCV 94.4 93.6 93.5  PLT 228 220 240   Cardiac Enzymes: No results for input(s): CKTOTAL, CKMB,  CKMBINDEX, TROPONINI in the last 168 hours. BNP: Invalid input(s): POCBNP CBG: No results for input(s): GLUCAP in the last 168 hours. D-Dimer No results for input(s): DDIMER in the last 72 hours. Hgb A1c No results for input(s): HGBA1C in the last 72 hours. Lipid Profile Recent Labs    05/08/19 1016  TRIG 142   Thyroid function studies No results for input(s): TSH, T4TOTAL, T3FREE, THYROIDAB in the last 72 hours.  Invalid input(s): FREET3 Anemia work up Recent Labs    05/08/19 1016 05/09/19 0551  FERRITIN 386* 364*   Urinalysis No results found for: COLORURINE, APPEARANCEUR, [place], [place], [place], [place], [place], [place], [place], UROBILINOGEN, NITRITE, LEUKOCYTESUR Sepsis Labs Invalid input(s): PROCALCITONIN,  WBC,  LACTICIDVEN Microbiology Recent Results (from the past 240 hour(s))  Blood Culture (routine x 2)     Status: None (Preliminary result)   Collection Time: 05/08/19  9:11 AM   Specimen: BLOOD  Result Value Ref Range Status   Specimen Description BLOOD BLOOD LEFT HAND  Final   Special Requests   Final    BOTTLES DRAWN AEROBIC AND ANAEROBIC Blood Culture adequate volume   Culture   Final    NO GROWTH < 24 HOURS Performed at [facility], [address] [place], [place] [address]    Report Status PENDING  Incomplete  Blood Culture (routine x 2)     Status: None (Preliminary  result)   Collection Time: 05/08/19 10:16 AM   Specimen: BLOOD  Result Value Ref Range Status   Specimen Description BLOOD BLOOD LEFT HAND  Final   Special Requests   Final    BOTTLES DRAWN AEROBIC AND ANAEROBIC Blood Culture adequate volume   Culture   Final    NO GROWTH < 24 HOURS Performed at [facility], [address] [place], [place] [address]    Report Status PENDING  Incomplete     Time coordinating discharge:35 minutes  SIGNED:   Louellen Molder, MD  [facility] 05/09/2019, 10:44 AM Pager   If 7PM-7AM, please contact night-coverage [email or web address] Password [email or web address]

## 2019-05-09 NOTE — Progress Notes (Signed)
Gave report to [place] in [place] value

## 2019-05-09 NOTE — H&P (Signed)
History and Physical   Angelica Barnett [id number] DOB: [birth year] DOA: 05/09/2019  Referring MD/NP/PA: Dr. Clementeen Graham  PCP: Dion Body, MD  Patient coming from: Home by way of [place]  Chief Complaint: Shortness of breath  HPI: Angelica Barnett is a 69 y.o. female with a history of obesity, HTN, and gout who presented to [facility] ED 1/24 with worsening shortness of breath for 2 days in the setting of having fatigue, cough, and fevers since 1/17 when she was diagnosed with covid-19. Pulse oximetry at home showed 84% and she continued to feel worse despite steroids and antibiotics as an outpatient. In the ED she was febrile to 103.65F , hypoxic, tachycardic, and tachypneic. CXR demonstrated bilateral infiltrates. ECG showed 14mm ST depression and TWI compared to prior, though the patient denied chest pain and cycled high sensitivity troponins were negative. CTA chest showed no PE, but confirmed multifocal infiltrates. Echocardiogram was performed 1/25 just prior to transfer to [facility]. Due to rising inflammatory markers and advancing hypoxemia, tocilizumab is given, steroids augmented, and remdesivir continued.  Review of Systems: +fever, chills, malaise, fatigue, generalized weakness, cough productive of white sputum, dyspnea, nausea. Had diarrhea several days ago, none since admission. Denies changes in vision or hearing, chest pain, palpitations, abdominal pain, vomiting, changes in bowel habits, blood in stool, change in bladder habits, arthralgias, wash and per HPI. All others reviewed and are negative.   Past Medical History:  Diagnosis Date  . Anemia    H/O WITH C/S  . Arthritis   . Family history of adverse reaction to anesthesia    PT MOM-N/V  . GERD (gastroesophageal reflux disease)    NO MEDS  . Gout   . Headache    H/O MIGRAINES  . Heart murmur   . Hypertension   . Insomnia   . Obesity    No personal PMH VTE  Past Surgical History:  Procedure Laterality Date  . BILATERAL  CARPAL TUNNEL RELEASE    . CESAREAN SECTION     X2  . COLONOSCOPY    . COLONOSCOPY WITH PROPOFOL N/A 12/08/2016   Procedure: COLONOSCOPY WITH PROPOFOL;  Surgeon: Lollie Sails, MD;  Location: [facility] ENDOSCOPY;  Service: Endoscopy;  Laterality: N/A;  . EYE SURGERY Bilateral    CATARACT  . KNEE ARTHROSCOPY WITH MENISCAL REPAIR Left 02/14/2016   Procedure: KNEE ARTHROSCOPY WITH MENISCAL REPAIR,debridement;  Surgeon: Corky Mull, MD;  Location: [facility] ORS;  Service: Orthopedics;  Laterality: Left;   - Never smoker, lives with husband who has a milder case.   reports that she has never smoked. She has never used smokeless tobacco. She reports current alcohol use. She reports that she does not use drugs. Allergies  Allergen Reactions  . Celecoxib Hives, Swelling and Anaphylaxis   Family History  Problem Relation Age of Onset  . Breast cancer Sister 85   - Family history otherwise reviewed and not pertinent. No FH clots Prior to Admission medications   Medication Sig Start Date End Date Taking? Authorizing Provider  acetaminophen (TYLENOL) 500 MG tablet Take 1,000 mg by mouth every 8 (eight) hours as needed for mild pain.    [provider]  albuterol (VENTOLIN HFA) 108 (90 Base) MCG/ACT inhaler Inhale 2 puffs into the lungs every 6 (six) hours as needed for wheezing or shortness of breath. 05/09/19   Dhungel, Nishant, MD  allopurinol (ZYLOPRIM) 300 MG tablet Take 300 mg by mouth daily. 02/21/19   [provider]  ascorbic acid (VITAMIN C)  500 MG tablet Take 1 tablet (500 mg total) by mouth daily. 05/10/19   Dhungel, Flonnie Overman, MD  Calcium Carbonate-Vitamin D (CALCIUM-VITAMIN D) 500-200 MG-UNIT tablet Take 1 tablet by mouth daily.    [provider]  carvedilol (COREG) 3.125 MG tablet Take 3.125 mg by mouth 2 (two) times daily. 04/21/19   [provider]  cetirizine (ZYRTEC) 10 MG tablet Take 10 mg by mouth at bedtime.    [provider]    chlorpheniramine-HYDROcodone (TUSSIONEX) 10-8 MG/5ML SUER Take 5 mLs by mouth every 12 (twelve) hours as needed for cough. 05/09/19   Dhungel, Nishant, MD  dexamethasone (DECADRON) 10 MG/ML injection Inject 0.6 mLs (6 mg total) into the vein daily. 05/09/19   Dhungel, Nishant, MD  enoxaparin (LOVENOX) 40 MG/0.4ML injection Inject 0.4 mLs (40 mg total) into the skin daily. 05/09/19   Dhungel, Nishant, MD  guaiFENesin-dextromethorphan (ROBITUSSIN DM) 100-10 MG/5ML syrup Take 10 mLs by mouth every 4 (four) hours as needed for cough. 05/09/19   Dhungel, Flonnie Overman, MD  indomethacin (INDOCIN) 50 MG capsule Take 50 mg by mouth as needed for mild pain or moderate pain.     [provider]  losartan (COZAAR) 100 MG tablet Take 100 mg by mouth daily. 03/21/19   [provider]  zinc sulfate 220 (50 Zn) MG capsule Take 1 capsule (220 mg total) by mouth daily. 05/10/19   Louellen Molder, MD    Physical Exam: Vitals:   05/09/19 1215 05/09/19 1300 05/09/19 1329  BP: (!) 162/76 (!) 141/78   Pulse: 94 94   Resp: 17 (!) 22   Temp: (!) 103 F (39.4 C) (!) 103 F (39.4 C)   TempSrc: Oral Oral   SpO2: 92% 90%   Height:   5\' 6"  (1.676 m)   Constitutional: Tired-appearing female in no distress, calm demeanor Eyes: Lids and conjunctivae normal, PERRL ENMT: Mucous membranes are moist. Posterior pharynx clear of any exudate or lesions. Normal dentition.  Neck: normal, supple, no masses, no thyromegaly Respiratory: Non-labored breathing tachypnea without accessory muscle use. Crackles in mid, lower lung zones bilaterally. No wheezing. Cardiovascular: Regular rate and rhythm, no murmurs, rubs, or gallops. No carotid bruits. No JVD. No significant LE edema. Palpable pedal pulses. Abdomen: Normoactive bowel sounds. No tenderness, non-distended, and no masses palpated. No hepatosplenomegaly. GU: No indwelling catheter Musculoskeletal: No clubbing / cyanosis. No joint deformity upper and lower  extremities. Good ROM, no contractures. Normal muscle tone.  Skin: Warm, dry. No rashes, wounds, or ulcers on visualized skin Neurologic: CN II-XII grossly intact. Speech normal. No focal deficits in motor strength or sensation in all extremities.  Psychiatric: Alert and oriented x3. Normal judgment and insight. Mood euthymic with congruent affect.   Labs on Admission: I have personally reviewed following labs and imaging studies  CBC: Recent Labs  Lab 05/08/19 0911 05/08/19 1710 05/09/19 0551  WBC 7.0 7.0 7.8  NEUTROABS 5.8  --  6.6  HGB 14.2 13.3 13.3  HCT 42.2 39.8 40.1  MCV 94.4 93.6 93.5  PLT 228 220 [id number]   Basic Metabolic Panel: Recent Labs  Lab 05/08/19 1016 05/08/19 1710 05/09/19 0551  NA 136  --  136  K 3.8  --  3.7  CL 96*  --  100  CO2 28  --  26  GLUCOSE 104*  --  114*  BUN 11  --  12  CREATININE 0.93 0.78 0.79  CALCIUM 8.6*  --  8.3*   GFR: Estimated Creatinine Clearance: 83.3  mL/min (by C-G formula based on SCr of 0.79 mg/dL). Liver Function Tests: Recent Labs  Lab 05/08/19 1016 05/09/19 0551  AST 39 36  ALT 29 29  ALKPHOS 75 67  BILITOT 0.6 0.8  PROT 7.6 7.1  ALBUMIN 3.6 3.4*   No results for input(s): LIPASE, AMYLASE in the last 168 hours. No results for input(s): AMMONIA in the last 168 hours. Coagulation Profile: No results for input(s): INR, PROTIME in the last 168 hours. Cardiac Enzymes: No results for input(s): CKTOTAL, CKMB, CKMBINDEX, TROPONINI in the last 168 hours. BNP (last 3 results) No results for input(s): PROBNP in the last 8760 hours. HbA1C: No results for input(s): HGBA1C in the last 72 hours. CBG: No results for input(s): GLUCAP in the last 168 hours. Lipid Profile: Recent Labs    05/08/19 1016  TRIG 142   Thyroid Function Tests: No results for input(s): TSH, T4TOTAL, FREET4, T3FREE, THYROIDAB in the last 72 hours. Anemia Panel: Recent Labs    05/08/19 1016 05/09/19 0551  FERRITIN 386* 364*   Urine  analysis: No results found for: COLORURINE, [place], LABSPEC, [place], GLUCOSEU, [place], BILIRUBINUR, KETONESUR, PROTEINUR, UROBILINOGEN, NITRITE, LEUKOCYTESUR  Recent Results (from the past 240 hour(s))  Blood Culture (routine x 2)     Status: None (Preliminary result)   Collection Time: 05/08/19  9:11 AM   Specimen: BLOOD  Result Value Ref Range Status   Specimen Description BLOOD BLOOD LEFT HAND  Final   Special Requests   Final    BOTTLES DRAWN AEROBIC AND ANAEROBIC Blood Culture adequate volume   Culture   Final    NO GROWTH < 24 HOURS Performed at [facility], [address], [place], [place] [address]    Report Status PENDING  Incomplete  Blood Culture (routine x 2)     Status: None (Preliminary result)   Collection Time: 05/08/19 10:16 AM   Specimen: BLOOD  Result Value Ref Range Status   Specimen Description BLOOD BLOOD LEFT HAND  Final   Special Requests   Final    BOTTLES DRAWN AEROBIC AND ANAEROBIC Blood Culture adequate volume   Culture   Final    NO GROWTH < 24 HOURS Performed at [facility], [address] [place], [place] [address]    Report Status PENDING  Incomplete     Radiological Exams on Admission: CT ANGIO CHEST PE W OR WO CONTRAST  Result Date: 05/08/2019 CLINICAL DATA:  Shortness of breath, difficulty breathing EXAM: CT ANGIOGRAPHY CHEST WITH CONTRAST TECHNIQUE: Multidetector CT imaging of the chest was performed using the standard protocol during bolus administration of intravenous contrast. Multiplanar CT image reconstructions and MIPs were obtained to evaluate the vascular anatomy. CONTRAST:  18mL OMNIPAQUE IOHEXOL 350 MG/ML SOLN COMPARISON:  None. FINDINGS: Cardiovascular: Satisfactory opacification of the pulmonary arteries to the segmental level. No evidence of pulmonary embolism. Mildly enlarged heart size. No pericardial effusion. Mediastinum/Nodes: No enlarged hilar, or axillary lymph nodes. Enlarged subcarinal lymph  measuring 13 mm. Multiple small right paratracheal lymph nodes the largest measuring 9 mm. Thyroid gland, trachea, and esophagus demonstrate no significant findings. Lungs/Pleura: Bilateral patchy areas of ground-glass opacity throughout the lung fields. No pleural effusion or pneumothorax. No pneumomediastinum. Upper Abdomen: No acute abnormality. Musculoskeletal: No acute osseous abnormality. No aggressive osseous lesion. Review of the MIP images confirms the above findings. IMPRESSION: 1. No evidence of pulmonary embolus. 2. Bilateral patchy ground-glass opacities throughout the lungs as can be seen with atypical viral pneumonia given the patient's history  of testing positive for COVID-19. Electronically Signed   By: Kathreen Devoid   On: 05/08/2019 16:59   DG Chest Port 1 View  Result Date: 05/08/2019 CLINICAL DATA:  Shortness of breath, COVID-19 positive EXAM: PORTABLE CHEST 1 VIEW COMPARISON:  None. FINDINGS: Bilateral interstitial and patchy alveolar airspace opacities. No pleural effusion or pneumothorax. No cardiomediastinal abnormality. No acute osseous abnormality. IMPRESSION: Bilateral interstitial and patchy alveolar airspace opacities concerning for multilobar pneumonia including atypical viral pneumonia. Electronically Signed   By: Kathreen Devoid   On: 05/08/2019 09:29   ECHOCARDIOGRAM LIMITED  Result Date: 05/09/2019   ECHOCARDIOGRAM LIMITED REPORT   Patient Name:   Angelica Barnett Date of Exam: 05/09/2019 Medical Rec #:  [id number]       Height:       65.0 in Accession #:    [id number]      Weight:       248.2 lb Date of Birth:  1950/05/09       BSA:          2.17 m Patient Age:    13 years        BP:           142/58 mmHg Patient Gender: F               HR:           94 bpm. Exam Location:  [facility]  Procedure: Limited Echo and Limited Color Doppler Indications:     R94.31 Abnormal ECG  History:         Patient has no prior history of Echocardiogram examinations. Pt                  tested positive  for novel coronavirus on 05/08/2019.  Sonographer:     Charmayne Sheer RDCS (AE) Referring Phys:  [place] Diagnosing Phys: Serafina Royals MD  Sonographer Comments: Technically difficult study due to poor echo windows. Image acquisition challenging due to patient body habitus. IMPRESSIONS  1. The left ventricle has normal function. There is no increased left ventricular wall thickness.  2. The left ventricle has no regional wall motion abnormalities.  3. Global right ventricle has normal systolc function.The right ventricular size is normal. no increase in right ventricular wall thickness.  4. The mitral valve is normal in structure. Trivial mitral valve regurgitation.  5. The tricuspid valve was normal in structure. Tricuspid valve regurgitation is trivial.  6. Tricuspid valve regurgitation is trivial.  7. The pulmonic valve was normal in structure. FINDINGS  Left Ventricle: The left ventricle has normal function. The left ventricle has no regional wall motion abnormalities. The left ventricular internal cavity size was the left ventricle is normal in size. There is no increased left ventricular wall thickness. Right Ventricle: The right ventricular size is normal. No increase in right ventricular wall thickness. Global RV systolic function is has normal systolic function. Left Atrium: Left atrial size was normal in size. Right Atrium: Right atrial size was normal in size. Right atrial pressure is estimated at 10 mmHg. Pericardium: There is no evidence of pericardial effusion is seen. There is no evidence of pericardial effusion. Mitral Valve: The mitral valve is normal in structure. Trivial mitral valve regurgitation. Tricuspid Valve: The tricuspid valve is normal in structure. Tricuspid valve regurgitation is trivial. Aortic Valve: The aortic valve is normal in structure. Aortic valve regurgitation is trivial. Pulmonic Valve: The pulmonic valve was normal in structure. Pulmonic valve regurgitation is not  visualized by color flow Doppler. Pulmonic regurgitation is not visualized by color flow Doppler. Aorta: The aortic root, ascending aorta and aortic arch are all structurally normal, with no evidence of dilitation or obstruction. Shunts: No atrial level shunt detected by color flow Doppler.  LEFT VENTRICLE         Normals PLAX 2D LVIDd:         5.33 cm 3.6 cm LVIDs:         3.53 cm 1.7 cm LV PW:         0.82 cm 1.4 cm LV IVS:        0.74 cm 1.3 cm LV SV:         85 ml   79 ml LV SV Index:   36.56   45 ml/m2  LEFT ATRIUM         Index LA diam:    3.80 cm 1.75 cm/m   AORTA                 Normals Ao Root diam: 3.00 cm 31 mm  Serafina Royals MD Electronically signed by Serafina Royals MD Signature Date/Time: 05/09/2019/1:07:36 PMThe mitral valve is normal in structure.    Final     EKG: Independently reviewed. Sinus tachycardia with subtle ST depression mostly in V5 and V6. No reciprocal STE.  Assessment/Plan Active Problems:   Acute respiratory failure due to COVID-19 [facility])   Hypertension   GERD (gastroesophageal reflux disease)   Gout   Anemia   Pneumonia due to COVID-19 virus   Abnormal EKG   Acute respiratory failure due to severe acute respiratory syndrome coronavirus 2 (SARS-CoV-2) infection (HCC)   Acute hypoxemic respiratory failure due to covid-19 pneumonia: SARS-CoV-2 reportedly positive on 1/17at outside urgent care.  - PCT undetectable x2, agree with stopping abx.  - Suspect after reviewing CTA chest this is moderate-severe covid-19 transitioning into cytokine storm. Discussed the off label use of tocilizumab, it's risks and potential benefits, inclusion and exclusion criteria are favorable. Will give 800mg  IV x1 now.  - Repeat CXR now. - Continue remdesivir x5 days 1/24 - 1/28 - Steroids, augment to solumedrol for now - Vitamin C, zinc - Encourage OOB, IS, FV, and awake proning if able - Tylenol and antitussives prn - Continue airborne, contact precautions while admitted. Isolation  period would be recommended for 21 days from positive testing. - Check CBC w/diff, CMP, CRP daily - Enoxaparin prophylactic dose.  - Maintain euvolemia/net negative.  - Avoid NSAIDs - Will request records of positive SARS-CoV-2 test result.  Abnormal ECG: ST depressions noted though lack of chest pain and hs-troponin negative x4 argue against ACS. Echocardiogram performed 1/25 is largely normal. No WMA or systolic dysfunction. - Continue monitoring  Gout:  - Continue home medications  HTN:  - Continue home medications  DVT prophylaxis: Lovenox 0.5mg /kg q24h  Code Status: Full  Family Communication: None at bedside, pt able to convey plan of care Disposition Plan: Uncertain Consults called: None  Admission status: Inpatient    Patrecia Pour, MD [facility] [email or web address] 05/09/2019, 2:05 PM

## 2019-05-09 NOTE — Progress Notes (Signed)
Family Update  Patient transferred for [facility] to [facility] via EMS. Primary RN spoke with patients daughter Maudie Mercury whom is a Therapist, sports (works for [employer]). Primary nurse reviewed POC (steriods, Acterma and Remdesivir) and educated on measures used to pulmonary toilet ie Flutter Valve/Insentive Spirometer/Proning. Daughter verbalized comprehension of teaching. Primary RN will continue to monitor patient, document and report assessment changes to MD.

## 2019-05-10 LAB — COMPREHENSIVE METABOLIC PANEL
ALT: 30 U/L (ref 0–44)
AST: 32 U/L (ref 15–41)
Albumin: 3.4 g/dL — ABNORMAL LOW (ref 3.5–5.0)
Alkaline Phosphatase: 63 U/L (ref 38–126)
Anion gap: 13 (ref 5–15)
BUN: 22 mg/dL (ref 8–23)
CO2: 25 mmol/L (ref 22–32)
Calcium: 8.5 mg/dL — ABNORMAL LOW (ref 8.9–10.3)
Chloride: 99 mmol/L (ref 98–111)
Creatinine, Ser: 0.87 mg/dL (ref 0.44–1.00)
GFR calc Af Amer: >60 mL/min (ref >60)
GFR calc non Af Amer: >60 mL/min (ref >60)
Glucose, Bld: 163 mg/dL — ABNORMAL HIGH (ref 70–99)
Potassium: 3.7 mmol/L (ref 3.5–5.1)
Sodium: 137 mmol/L (ref 135–145)
Total Bilirubin: 0.7 mg/dL (ref 0.3–1.2)
Total Protein: 7.2 g/dL (ref 6.5–8.1)

## 2019-05-10 LAB — CBC WITH DIFFERENTIAL/PLATELET
Abs Immature Granulocytes: 0.06 10*3/uL (ref 0.00–0.07)
Basophils Absolute: 0 10*3/uL (ref 0.0–0.1)
Basophils Relative: 0 %
Eosinophils Absolute: 0 10*3/uL (ref 0.0–0.5)
Eosinophils Relative: 0 %
HCT: 40.9 % (ref 36.0–46.0)
Hemoglobin: 13.4 g/dL (ref 12.0–15.0)
Immature Granulocytes: 1 %
Lymphocytes Relative: 12 %
Lymphs Abs: 0.5 10*3/uL — ABNORMAL LOW (ref 0.7–4.0)
MCH: 31.5 pg (ref 26.0–34.0)
MCHC: 32.8 g/dL (ref 30.0–36.0)
MCV: 96 fL (ref 80.0–100.0)
Monocytes Absolute: 0.1 10*3/uL (ref 0.1–1.0)
Monocytes Relative: 3 %
Neutro Abs: 3.5 10*3/uL (ref 1.7–7.7)
Neutrophils Relative %: 84 %
Platelets: 267 10*3/uL (ref 150–400)
RBC: 4.26 MIL/uL (ref 3.87–5.11)
RDW: 13.1 % (ref 11.5–15.5)
WBC: 4.2 10*3/uL (ref 4.0–10.5)
nRBC: 0 % (ref 0.0–0.2)

## 2019-05-10 LAB — D-DIMER, QUANTITATIVE: D-Dimer, Quant: 0.87 ug/mL-FEU — ABNORMAL HIGH (ref 0.00–0.50)

## 2019-05-10 LAB — C-REACTIVE PROTEIN: CRP: 15.7 mg/dL — ABNORMAL HIGH (ref <1.0)

## 2019-05-10 LAB — ABO/RH: ABO/RH(D): A NEG

## 2019-05-10 LAB — FERRITIN: Ferritin: 372 ng/mL — ABNORMAL HIGH (ref 11–307)

## 2019-05-10 MED ORDER — TOCILIZUMAB 400 MG/20ML IV SOLN
800.0000 mg | Freq: Once | INTRAVENOUS | Status: AC
  Administered 2019-05-10: 12:00:00 800 mg via INTRAVENOUS
  Filled 2019-05-10: qty 40

## 2019-05-10 NOTE — Progress Notes (Signed)
PROGRESS NOTE  Angelica Barnett  [id number] DOB: 08-24-1950 DOA: 05/09/2019 PCP: Dion Body, MD   Brief Narrative: Angelica Barnett is a 69 y.o. female with a history of obesity, HTN, and gout who presented to [facility] ED 1/24 with worsening shortness of breath for 2 days in the setting of having fatigue, cough, and fevers since 1/17 when she was diagnosed with covid-19. In the ED she was febrile to 103.79F , hypoxic, tachycardic, and tachypneic. CXR demonstrated bilateral infiltrates. CTA chest showed no PE, but confirmed multifocal infiltrates. Due to rising inflammatory markers and advancing hypoxemia, tocilizumab was given 1/25, steroids augmented, and remdesivir continued. Hypoxemia continues to worsen, inflammatory markers continue to rise, so tocilizumab repeat dose 1/26.  Assessment & Plan: Active Problems:   Acute respiratory failure due to COVID-19 [facility])   Hypertension   GERD (gastroesophageal reflux disease)   Gout   Anemia   Pneumonia due to COVID-19 virus   Abnormal EKG   Acute respiratory failure due to severe acute respiratory syndrome coronavirus 2 (SARS-CoV-2) infection (HCC)  Acute hypoxemic respiratory failure due to covid-19 pneumonia: SARS-CoV-2 PCR positive on 1/17 (available in Care Everywhere) - PCT undetectable x2, stopped abx - CRP continues rising, hypoxemia worsening. Will repeat tocilizumab dosing 1/26.  - Continue remdesivir x5 days 1/24 - 1/28 - Continue steroids - Vitamin C, zinc - Encourage OOB, IS, FV, and awake proning if able - Tylenol and antitussives prn - Continue airborne, contact precautions while admitted. Isolation period would be recommended for 21 days from positive testing.  Abnormal ECG: ST depressions noted though lack of chest pain and hs-troponin negative x4 argue against ACS. Echocardiogram performed 1/25 is largely normal. No WMA or systolic dysfunction. - Continue monitoring  Gout:  - Continue home medications  HTN:  -  Continue home medications  Obesity: BMI 40.   DVT prophylaxis: Lovenox 0.5mg /kg q24h Code Status: Full Family Communication: Daughter by phone Disposition Plan: Uncertain  Consultants:   None  Procedures:   Echocardiogram 05/09/2019: 1. The left ventricle has normal function. There is no increased left ventricular wall thickness.  2. The left ventricle has no regional wall motion abnormalities.  3. Global right ventricle has normal systolc function.The right ventricular size is normal. no increase in right ventricular wall thickness.  4. The mitral valve is normal in structure. Trivial mitral valve regurgitation.  5. The tricuspid valve was normal in structure. Tricuspid valve regurgitation is trivial.  6. Tricuspid valve regurgitation is trivial.  7. The pulmonic valve was normal in structure.  Antimicrobials:  Remdesivir 1/24 - 1/28   Subjective: Still feels worn down, severely diffusely weak, short of breath constantly, worse with exertion, better with increasing O2, not associated with chest pain.  Objective: Vitals:   05/09/19 2100 05/10/19 0000 05/10/19 0450 05/10/19 0741  BP: 107/67 120/69 128/87 105/90  Pulse: 68 66 67 83  Resp: (!) 24 (!) 23 (!) 27 (!) 21  Temp: 98.8 F (37.1 C) 98.6 F (37 C) 98.3 F (36.8 C) 98.3 F (36.8 C)  TempSrc: Oral Oral Oral Oral  SpO2: 93% 91% 91% 92%  Weight:      Height:        Intake/Output Summary (Last 24 hours) at 05/10/2019 1100 Last data filed at 05/10/2019 0450 Gross per 24 hour  Intake 580 ml  Output 1130 ml  Net -550 ml   Filed Weights   05/09/19 1756  Weight: 112.6 kg    Gen: 69 y.o. female in no distress  Pulm: Tachypneic on 10L O2 at rest with diffuse crackles bilaterally.  CV: Regular rate and rhythm. No murmur, rub, or gallop. No JVD, no pedal edema. GI: Abdomen soft, non-tender, non-distended, with normoactive bowel sounds. No organomegaly or masses felt. Ext: Warm, no deformities Skin: No rashes,  lesions or ulcers Neuro: Alert and oriented. No focal neurological deficits. Psych: Judgement and insight appear normal. Mood & affect appropriate.   Data Reviewed: I have personally reviewed following labs and imaging studies  CBC: Recent Labs  Lab 05/08/19 0911 05/08/19 1710 05/09/19 0551 05/10/19 0220  WBC 7.0 7.0 7.8 4.2  NEUTROABS 5.8  --  6.6 3.5  HGB 14.2 13.3 13.3 13.4  HCT 42.2 39.8 40.1 40.9  MCV 94.4 93.6 93.5 96.0  PLT 228 220 240 [id number]   Basic Metabolic Panel: Recent Labs  Lab 05/08/19 1016 05/08/19 1710 05/09/19 0551 05/10/19 0220  NA 136  --  136 137  K 3.8  --  3.7 3.7  CL 96*  --  100 99  CO2 28  --  26 25  GLUCOSE 104*  --  114* 163*  BUN 11  --  12 22  CREATININE 0.93 0.78 0.79 0.87  CALCIUM 8.6*  --  8.3* 8.5*   GFR: Estimated Creatinine Clearance: 76.6 mL/min (by C-G formula based on SCr of 0.87 mg/dL). Liver Function Tests: Recent Labs  Lab 05/08/19 1016 05/09/19 0551 05/10/19 0220  AST 39 36 32  ALT 29 29 30   ALKPHOS 75 67 63  BILITOT 0.6 0.8 0.7  PROT 7.6 7.1 7.2  ALBUMIN 3.6 3.4* 3.4*   No results for input(s): LIPASE, AMYLASE in the last 168 hours. No results for input(s): AMMONIA in the last 168 hours. Coagulation Profile: No results for input(s): INR, PROTIME in the last 168 hours. Cardiac Enzymes: No results for input(s): CKTOTAL, CKMB, CKMBINDEX, TROPONINI in the last 168 hours. BNP (last 3 results) No results for input(s): PROBNP in the last 8760 hours. HbA1C: No results for input(s): HGBA1C in the last 72 hours. CBG: No results for input(s): GLUCAP in the last 168 hours. Lipid Profile: Recent Labs    05/08/19 1016  TRIG 142   Thyroid Function Tests: No results for input(s): TSH, T4TOTAL, FREET4, T3FREE, THYROIDAB in the last 72 hours. Anemia Panel: Recent Labs    05/09/19 0551 05/10/19 0220  FERRITIN 364* 372*   Urine analysis: No results found for: COLORURINE, APPEARANCEUR, LABSPEC, PHURINE, GLUCOSEU, HGBUR,  BILIRUBINUR, KETONESUR, PROTEINUR, UROBILINOGEN, NITRITE, LEUKOCYTESUR Recent Results (from the past 240 hour(s))  Blood Culture (routine x 2)     Status: None (Preliminary result)   Collection Time: 05/08/19  9:11 AM   Specimen: BLOOD  Result Value Ref Range Status   Specimen Description BLOOD BLOOD LEFT HAND  Final   Special Requests   Final    BOTTLES DRAWN AEROBIC AND ANAEROBIC Blood Culture adequate volume   Culture   Final    NO GROWTH 2 DAYS Performed at [facility], [address] [place], [place] [address]    Report Status PENDING  Incomplete  Blood Culture (routine x 2)     Status: None (Preliminary result)   Collection Time: 05/08/19 10:16 AM   Specimen: BLOOD  Result Value Ref Range Status   Specimen Description BLOOD BLOOD LEFT HAND  Final   Special Requests   Final    BOTTLES DRAWN AEROBIC AND ANAEROBIC Blood Culture adequate volume   Culture   Final    NO GROWTH  2 DAYS Performed at [facility], [address] [place], [place] [address]    Report Status PENDING  Incomplete      Radiology Studies: CT ANGIO CHEST PE W OR WO CONTRAST  Result Date: 05/08/2019 CLINICAL DATA:  Shortness of breath, difficulty breathing EXAM: CT ANGIOGRAPHY CHEST WITH CONTRAST TECHNIQUE: Multidetector CT imaging of the chest was performed using the standard protocol during bolus administration of intravenous contrast. Multiplanar CT image reconstructions and MIPs were obtained to evaluate the vascular anatomy. CONTRAST:  167mL OMNIPAQUE IOHEXOL 350 MG/ML SOLN COMPARISON:  None. FINDINGS: Cardiovascular: Satisfactory opacification of the pulmonary arteries to the segmental level. No evidence of pulmonary embolism. Mildly enlarged heart size. No pericardial effusion. Mediastinum/Nodes: No enlarged hilar, or axillary lymph nodes. Enlarged subcarinal lymph measuring 13 mm. Multiple small right paratracheal lymph nodes the largest measuring 9 mm. Thyroid gland, trachea, and  esophagus demonstrate no significant findings. Lungs/Pleura: Bilateral patchy areas of ground-glass opacity throughout the lung fields. No pleural effusion or pneumothorax. No pneumomediastinum. Upper Abdomen: No acute abnormality. Musculoskeletal: No acute osseous abnormality. No aggressive osseous lesion. Review of the MIP images confirms the above findings. IMPRESSION: 1. No evidence of pulmonary embolus. 2. Bilateral patchy ground-glass opacities throughout the lungs as can be seen with atypical viral pneumonia given the patient's history of testing positive for COVID-19. Electronically Signed   By: Kathreen Devoid   On: 05/08/2019 16:59   Portable chest 1 View  Result Date: 05/09/2019 CLINICAL DATA:  69 year old female with history of shortness of breath. EXAM: PORTABLE CHEST 1 VIEW COMPARISON:  Chest x-ray 05/08/2019. FINDINGS: Lung volumes are low. Patchy multifocal ill-defined airspace disease and interstitial prominence throughout the mid to lower lungs bilaterally which remains highly concerning for severe multilobar bilateral pneumonia. No pleural effusions. No pneumothorax. No evidence of pulmonary edema. Heart size is normal. Upper mediastinal contours are within normal limits. IMPRESSION: 1. The appearance of the lungs is compatible with severe multilobar bilateral pneumonia, likely related to COVID-19 infection. Electronically Signed   By: Vinnie Langton M.D.   On: 05/09/2019 14:35   ECHOCARDIOGRAM LIMITED  Result Date: 05/09/2019   ECHOCARDIOGRAM LIMITED REPORT   Patient Name:   KAYSHLA CAWTHORN Date of Exam: 05/09/2019 Medical Rec #:  [place]:[id number]       Height:       65.0 in Accession #:    [id number]      Weight:       248.2 lb Date of Birth:  04/28/1950       BSA:          2.17 m Patient Age:    13 years        BP:           142/58 mmHg Patient Gender: F               HR:           94 bpm. Exam Location:  [facility]  Procedure: Limited Echo and Limited Color Doppler Indications:     R94.31 Abnormal  ECG  History:         Patient has no prior history of Echocardiogram examinations. Pt                  tested positive for novel coronavirus on 05/08/2019.  Sonographer:     Charmayne Sheer RDCS (AE) Referring Phys:  [place] Diagnosing Phys: Serafina Royals MD  Sonographer Comments: Technically difficult study due to poor echo windows.  Image acquisition challenging due to patient body habitus. IMPRESSIONS  1. The left ventricle has normal function. There is no increased left ventricular wall thickness.  2. The left ventricle has no regional wall motion abnormalities.  3. Global right ventricle has normal systolc function.The right ventricular size is normal. no increase in right ventricular wall thickness.  4. The mitral valve is normal in structure. Trivial mitral valve regurgitation.  5. The tricuspid valve was normal in structure. Tricuspid valve regurgitation is trivial.  6. Tricuspid valve regurgitation is trivial.  7. The pulmonic valve was normal in structure. FINDINGS  Left Ventricle: The left ventricle has normal function. The left ventricle has no regional wall motion abnormalities. The left ventricular internal cavity size was the left ventricle is normal in size. There is no increased left ventricular wall thickness. Right Ventricle: The right ventricular size is normal. No increase in right ventricular wall thickness. Global RV systolic function is has normal systolic function. Left Atrium: Left atrial size was normal in size. Right Atrium: Right atrial size was normal in size. Right atrial pressure is estimated at 10 mmHg. Pericardium: There is no evidence of pericardial effusion is seen. There is no evidence of pericardial effusion. Mitral Valve: The mitral valve is normal in structure. Trivial mitral valve regurgitation. Tricuspid Valve: The tricuspid valve is normal in structure. Tricuspid valve regurgitation is trivial. Aortic Valve: The aortic valve is normal in structure. Aortic valve  regurgitation is trivial. Pulmonic Valve: The pulmonic valve was normal in structure. Pulmonic valve regurgitation is not visualized by color flow Doppler. Pulmonic regurgitation is not visualized by color flow Doppler. Aorta: The aortic root, ascending aorta and aortic arch are all structurally normal, with no evidence of dilitation or obstruction. Shunts: No atrial level shunt detected by color flow Doppler.  LEFT VENTRICLE         Normals PLAX 2D LVIDd:         5.33 cm 3.6 cm LVIDs:         3.53 cm 1.7 cm LV PW:         0.82 cm 1.4 cm LV IVS:        0.74 cm 1.3 cm LV SV:         85 ml   79 ml LV SV Index:   36.56   45 ml/m2  LEFT ATRIUM         Index LA diam:    3.80 cm 1.75 cm/m   AORTA                 Normals Ao Root diam: 3.00 cm 31 mm  Serafina Royals MD Electronically signed by Serafina Royals MD Signature Date/Time: 05/09/2019/1:07:36 PMThe mitral valve is normal in structure.    Final     Scheduled Meds:  allopurinol  300 mg Oral Daily   vitamin C  500 mg Oral Daily   calcium-vitamin D  1 tablet Oral Daily   carvedilol  3.125 mg Oral BID   enoxaparin (LOVENOX) injection  0.5 mg/kg Subcutaneous Daily   loratadine  10 mg Oral Daily   losartan  100 mg Oral Daily   methylPREDNISolone (SOLU-MEDROL) injection  60 mg Intravenous Q8H   sodium chloride flush  3 mL Intravenous Q12H   zinc sulfate  220 mg Oral Daily   Continuous Infusions:  remdesivir 100 mg in NS 100 mL 100 mg (05/10/19 0930)   tocilizumab (ACTEMRA) - non-COVID treatment       LOS: 1 day  Time spent: 35 minutes.  Patrecia Pour, MD [facility] [email or web address] 05/10/2019, 11:00 AM

## 2019-05-10 NOTE — Progress Notes (Signed)
Family update  Primary Nurse spoke with patients Daughter Maudie Mercury to review current status and POC. Patients daughter brought pull ups and inc pads to facility for patient. AC delivered to patient bedside

## 2019-05-11 DIAGNOSIS — J9601 Acute respiratory failure with hypoxia: Secondary | ICD-10-CM

## 2019-05-11 LAB — COMPREHENSIVE METABOLIC PANEL
ALT: 29 U/L (ref 0–44)
AST: 28 U/L (ref 15–41)
Albumin: 3.1 g/dL — ABNORMAL LOW (ref 3.5–5.0)
Alkaline Phosphatase: 59 U/L (ref 38–126)
Anion gap: 11 (ref 5–15)
BUN: 26 mg/dL — ABNORMAL HIGH (ref 8–23)
CO2: 28 mmol/L (ref 22–32)
Calcium: 8.4 mg/dL — ABNORMAL LOW (ref 8.9–10.3)
Chloride: 97 mmol/L — ABNORMAL LOW (ref 98–111)
Creatinine, Ser: 0.82 mg/dL (ref 0.44–1.00)
GFR calc Af Amer: >60 mL/min (ref >60)
GFR calc non Af Amer: >60 mL/min (ref >60)
Glucose, Bld: 156 mg/dL — ABNORMAL HIGH (ref 70–99)
Potassium: 3.8 mmol/L (ref 3.5–5.1)
Sodium: 136 mmol/L (ref 135–145)
Total Bilirubin: 0.9 mg/dL (ref 0.3–1.2)
Total Protein: 6.5 g/dL (ref 6.5–8.1)

## 2019-05-11 LAB — C-REACTIVE PROTEIN: CRP: 7.9 mg/dL — ABNORMAL HIGH (ref <1.0)

## 2019-05-11 LAB — CBC WITH DIFFERENTIAL/PLATELET
Abs Immature Granulocytes: 0.09 10*3/uL — ABNORMAL HIGH (ref 0.00–0.07)
Basophils Absolute: 0 10*3/uL (ref 0.0–0.1)
Basophils Relative: 0 %
Eosinophils Absolute: 0 10*3/uL (ref 0.0–0.5)
Eosinophils Relative: 0 %
HCT: 38.7 % (ref 36.0–46.0)
Hemoglobin: 13.1 g/dL (ref 12.0–15.0)
Immature Granulocytes: 1 %
Lymphocytes Relative: 8 %
Lymphs Abs: 0.7 10*3/uL (ref 0.7–4.0)
MCH: 32 pg (ref 26.0–34.0)
MCHC: 33.9 g/dL (ref 30.0–36.0)
MCV: 94.6 fL (ref 80.0–100.0)
Monocytes Absolute: 0.4 10*3/uL (ref 0.1–1.0)
Monocytes Relative: 4 %
Neutro Abs: 7 10*3/uL (ref 1.7–7.7)
Neutrophils Relative %: 87 %
Platelets: 314 10*3/uL (ref 150–400)
RBC: 4.09 MIL/uL (ref 3.87–5.11)
RDW: 12.6 % (ref 11.5–15.5)
WBC: 8.1 10*3/uL (ref 4.0–10.5)
nRBC: 0 % (ref 0.0–0.2)

## 2019-05-11 LAB — D-DIMER, QUANTITATIVE: D-Dimer, Quant: 0.48 ug/mL-FEU (ref 0.00–0.50)

## 2019-05-11 LAB — FERRITIN: Ferritin: 381 ng/mL — ABNORMAL HIGH (ref 11–307)

## 2019-05-11 NOTE — Progress Notes (Signed)
Family Update  Primary RN spoke with patients daughter Maudie Mercury concerning status and POC.

## 2019-05-11 NOTE — Progress Notes (Signed)
[facility] PROGRESS NOTE    Progress Note  Angelica Barnett  [id number] DOB: December 20, 1950 DOA: 05/09/2019 PCP: Dion Body, MD     Brief Narrative:   Angelica Barnett is an 69 y.o. female past medical history of obesity, essential hypertension gout who presents to [facility] on 05/08/2019 for shortness of breath that started 2 days prior to admission.  Her SARS-CoV-2 PCR came back positive on 04/30/2018 on admission in the ED she was found with a temperature of 103 hypoxic and tachycardic, chest x-ray showed bilateral infiltrates CT angio of the chest was negative for PE but it did come for multifocal pneumonia, her hypoxia progressed she was given Tocilizumab on 05/08/2018 and she was started on IV steroids and remdesivir.  Assessment/Plan:   Acute respiratory failure due to COVID-19/  Pneumonia due to COVID-19 virus/now with severe ARDS due to SARS-CoV-2 infection: SARS-CoV-2 PCR positive on 05/01/2019. Procalcitonin was undetectable. On admission she was requiring 3 L, today she is requiring 11 L of high flow nasal cannula to keep saturations greater 90%. She was started on IV remdesivir and steroids she has received 2 doses of IV Actemra. Her inflammatory markers are improving. Continue vitamin C and zinc. Keep strict I's and O's and daily weights, try to keep the patient prone for at least 16 hours a day if not prone out of bed to chair.  Abnormal EKG: The patient is chest pain-free cardiac biomarkers are negative.  Echocardiogram on 05/09/2019 showed no wall motion abnormalities.  Gout: Continue current home medications.  Essential hypertension: Pressure stable continue current home regimen.  Morbid obesity: With a BMI greater than 40 she has been counseled.   DVT prophylaxis: lovenox Family Communication:none Disposition Plan/Barrier to D/C: From home she will probably need to go home, will consult physical therapy. Code Status:     Code Status Orders  (From  admission, onward)         Start     Ordered   05/09/19 1244  Full code  Continuous     05/09/19 1246        Code Status History    Date Active Date Inactive Code Status Order ID Comments User Context   05/08/2019 1242 05/09/2019 1230 Full Code [id number]  Para Skeans, MD ED   Advance Care Planning Activity    Advance Directive Documentation     Most Recent Value  Type of Advance Directive  Healthcare Power of Attorney  Pre-existing out of facility DNR order (yellow form or pink MOST form)  --  "MOST" Form in Place?  --        IV Access:    Peripheral IV   Procedures and diagnostic studies:   Portable chest 1 View  Result Date: 05/09/2019 CLINICAL DATA:  69 year old female with history of shortness of breath. EXAM: PORTABLE CHEST 1 VIEW COMPARISON:  Chest x-ray 05/08/2019. FINDINGS: Lung volumes are low. Patchy multifocal ill-defined airspace disease and interstitial prominence throughout the mid to lower lungs bilaterally which remains highly concerning for severe multilobar bilateral pneumonia. No pleural effusions. No pneumothorax. No evidence of pulmonary edema. Heart size is normal. Upper mediastinal contours are within normal limits. IMPRESSION: 1. The appearance of the lungs is compatible with severe multilobar bilateral pneumonia, likely related to COVID-19 infection. Electronically Signed   By: Vinnie Langton M.D.   On: 05/09/2019 14:35   ECHOCARDIOGRAM LIMITED  Result Date: 05/09/2019   ECHOCARDIOGRAM LIMITED REPORT   Patient Name:   CALLI TEELE Date  of Exam: 05/09/2019 Medical Rec #:  [place]:[id number]       Height:       65.0 in Accession #:    [id number]      Weight:       248.2 lb Date of Birth:  March 17, 1951       BSA:          2.17 m Patient Age:    11 years        BP:           142/58 mmHg Patient Gender: F               HR:           94 bpm. Exam Location:  [facility]  Procedure: Limited Echo and Limited Color Doppler Indications:     R94.31 Abnormal ECG  History:          Patient has no prior history of Echocardiogram examinations. Pt                  tested positive for novel coronavirus on 05/08/2019.  Sonographer:     Charmayne Sheer RDCS (AE) Referring Phys:  [place] Diagnosing Phys: Serafina Royals MD  Sonographer Comments: Technically difficult study due to poor echo windows. Image acquisition challenging due to patient body habitus. IMPRESSIONS  1. The left ventricle has normal function. There is no increased left ventricular wall thickness.  2. The left ventricle has no regional wall motion abnormalities.  3. Global right ventricle has normal systolc function.The right ventricular size is normal. no increase in right ventricular wall thickness.  4. The mitral valve is normal in structure. Trivial mitral valve regurgitation.  5. The tricuspid valve was normal in structure. Tricuspid valve regurgitation is trivial.  6. Tricuspid valve regurgitation is trivial.  7. The pulmonic valve was normal in structure. FINDINGS  Left Ventricle: The left ventricle has normal function. The left ventricle has no regional wall motion abnormalities. The left ventricular internal cavity size was the left ventricle is normal in size. There is no increased left ventricular wall thickness. Right Ventricle: The right ventricular size is normal. No increase in right ventricular wall thickness. Global RV systolic function is has normal systolic function. Left Atrium: Left atrial size was normal in size. Right Atrium: Right atrial size was normal in size. Right atrial pressure is estimated at 10 mmHg. Pericardium: There is no evidence of pericardial effusion is seen. There is no evidence of pericardial effusion. Mitral Valve: The mitral valve is normal in structure. Trivial mitral valve regurgitation. Tricuspid Valve: The tricuspid valve is normal in structure. Tricuspid valve regurgitation is trivial. Aortic Valve: The aortic valve is normal in structure. Aortic valve regurgitation is trivial.  Pulmonic Valve: The pulmonic valve was normal in structure. Pulmonic valve regurgitation is not visualized by color flow Doppler. Pulmonic regurgitation is not visualized by color flow Doppler. Aorta: The aortic root, ascending aorta and aortic arch are all structurally normal, with no evidence of dilitation or obstruction. Shunts: No atrial level shunt detected by color flow Doppler.  LEFT VENTRICLE         Normals PLAX 2D LVIDd:         5.33 cm 3.6 cm LVIDs:         3.53 cm 1.7 cm LV PW:         0.82 cm 1.4 cm LV IVS:        0.74 cm 1.3 cm LV SV:  85 ml   79 ml LV SV Index:   36.56   45 ml/m2  LEFT ATRIUM         Index LA diam:    3.80 cm 1.75 cm/m   AORTA                 Normals Ao Root diam: 3.00 cm 31 mm  Serafina Royals MD Electronically signed by Serafina Royals MD Signature Date/Time: 05/09/2019/1:07:36 PMThe mitral valve is normal in structure.    Final      Medical Consultants:    None.  Anti-Infectives:   IV remdesivir  Subjective:    Angelica Barnett she relates she is still persistently short of breath not feel better than yesterday.  Objective:    Vitals:   05/10/19 2003 05/11/19 0000 05/11/19 0400 05/11/19 0730  BP: 136/67 (!) 149/78 134/74 128/62  Pulse: 83 76 77 78  Resp: 20  18   Temp: 98.2 F (36.8 C) 99.3 F (37.4 C) 98.3 F (36.8 C) 97.9 F (36.6 C)  TempSrc: Oral Oral Oral Oral  SpO2: 93% 96% 90% 90%  Weight:      Height:       SpO2: 90 % O2 Flow Rate (L/min): 11 L/min FiO2 (%): 100 %   Intake/Output Summary (Last 24 hours) at 05/11/2019 0743 Last data filed at 05/11/2019 0400 Gross per 24 hour  Intake 820 ml  Output 920 ml  Net -100 ml   Filed Weights   05/09/19 1756  Weight: 112.6 kg    Exam: General exam: In no acute distress. Respiratory system: Good air movement and clear to auscultation. Cardiovascular system: S1 & S2 heard, RRR. No JVD. Gastrointestinal system: Abdomen is nondistended, soft and nontender.  Central nervous  system: Alert and oriented. Extremities: No pedal edema. Skin: No rashes, lesions or ulcers Psychiatry: Judgement and insight appear normal. Mood & affect appropriate.   Data Reviewed:    Labs: Basic Metabolic Panel: Recent Labs  Lab 05/08/19 1016 05/08/19 1016 05/08/19 1710 05/09/19 0551 05/09/19 0551 05/10/19 0220 05/11/19 0255  NA 136  --   --  136  --  137 136  K 3.8   < >  --  3.7   < > 3.7 3.8  CL 96*  --   --  100  --  99 97*  CO2 28  --   --  26  --  25 28  GLUCOSE 104*  --   --  114*  --  163* 156*  BUN 11  --   --  12  --  22 26*  CREATININE 0.93  --  0.78 0.79  --  0.87 0.82  CALCIUM 8.6*  --   --  8.3*  --  8.5* 8.4*   < > = values in this interval not displayed.   GFR Estimated Creatinine Clearance: 81.2 mL/min (by C-G formula based on SCr of 0.82 mg/dL). Liver Function Tests: Recent Labs  Lab 05/08/19 1016 05/09/19 0551 05/10/19 0220 05/11/19 0255  AST 39 36 32 28  ALT 29 29 30 29   ALKPHOS 75 67 63 59  BILITOT 0.6 0.8 0.7 0.9  PROT 7.6 7.1 7.2 6.5  ALBUMIN 3.6 3.4* 3.4* 3.1*   No results for input(s): LIPASE, AMYLASE in the last 168 hours. No results for input(s): AMMONIA in the last 168 hours. Coagulation profile No results for input(s): INR, PROTIME in the last 168 hours. COVID-19 Labs  Recent Labs    05/08/19  1016 05/08/19 1016 05/08/19 1710 05/09/19 0551 05/10/19 0220 05/11/19 0255  DDIMER  --   --   --   --  0.87*  --   FERRITIN 386*   < >  --  364* 372* 381*  LDH 262*  --  265*  --   --   --   CRP 3.5*   < >  --  13.8* 15.7* 7.9*   < > = values in this interval not displayed.    No results found for: SARSCOV2NAA  CBC: Recent Labs  Lab 05/08/19 0911 05/08/19 1710 05/09/19 0551 05/10/19 0220 05/11/19 0255  WBC 7.0 7.0 7.8 4.2 8.1  NEUTROABS 5.8  --  6.6 3.5 7.0  HGB 14.2 13.3 13.3 13.4 13.1  HCT 42.2 39.8 40.1 40.9 38.7  MCV 94.4 93.6 93.5 96.0 94.6  PLT 228 220 240 267 314   Cardiac Enzymes: No results for  input(s): CKTOTAL, CKMB, CKMBINDEX, TROPONINI in the last 168 hours. BNP (last 3 results) No results for input(s): PROBNP in the last 8760 hours. CBG: No results for input(s): GLUCAP in the last 168 hours. D-Dimer: Recent Labs    05/10/19 0220  DDIMER 0.87*   Hgb A1c: No results for input(s): HGBA1C in the last 72 hours. Lipid Profile: Recent Labs    05/08/19 1016  TRIG 142   Thyroid function studies: No results for input(s): TSH, T4TOTAL, T3FREE, THYROIDAB in the last 72 hours.  Invalid input(s): FREET3 Anemia work up: Recent Labs    05/10/19 0220 05/11/19 0255  FERRITIN 372* 381*   Sepsis Labs: Recent Labs  Lab 05/08/19 0911 05/08/19 0911 05/08/19 1016 05/08/19 1710 05/09/19 0551 05/10/19 0220 05/11/19 0255  PROCALCITON  --   --  <0.10 <0.10  --   --   --   WBC 7.0   < >  --  7.0 7.8 4.2 8.1  LATICACIDVEN 1.6  --   --   --   --   --   --    < > = values in this interval not displayed.   Microbiology Recent Results (from the past 240 hour(s))  Blood Culture (routine x 2)     Status: None (Preliminary result)   Collection Time: 05/08/19  9:11 AM   Specimen: BLOOD  Result Value Ref Range Status   Specimen Description BLOOD BLOOD LEFT HAND  Final   Special Requests   Final    BOTTLES DRAWN AEROBIC AND ANAEROBIC Blood Culture adequate volume   Culture   Final    NO GROWTH 3 DAYS Performed at [facility], [address] [place], [place] [address]    Report Status PENDING  Incomplete  Blood Culture (routine x 2)     Status: None (Preliminary result)   Collection Time: 05/08/19 10:16 AM   Specimen: BLOOD  Result Value Ref Range Status   Specimen Description BLOOD BLOOD LEFT HAND  Final   Special Requests   Final    BOTTLES DRAWN AEROBIC AND ANAEROBIC Blood Culture adequate volume   Culture   Final    NO GROWTH 3 DAYS Performed at [facility], [address], [place],  [address]    Report Status PENDING  Incomplete      Medications:   . allopurinol  300 mg Oral Daily  . vitamin C  500 mg Oral Daily  . calcium-vitamin D  1 tablet Oral Daily  . carvedilol  3.125 mg Oral BID  . enoxaparin (LOVENOX) injection  0.5  mg/kg Subcutaneous Daily  . loratadine  10 mg Oral Daily  . losartan  100 mg Oral Daily  . methylPREDNISolone (SOLU-MEDROL) injection  60 mg Intravenous Q8H  . sodium chloride flush  3 mL Intravenous Q12H  . zinc sulfate  220 mg Oral Daily   Continuous Infusions: . remdesivir 100 mg in NS 100 mL 100 mg (05/10/19 0930)      LOS: 2 days   Charlynne Cousins  [facility]  05/11/2019, 7:43 AM

## 2019-05-12 LAB — COMPREHENSIVE METABOLIC PANEL
ALT: 26 U/L (ref 0–44)
AST: 23 U/L (ref 15–41)
Albumin: 2.9 g/dL — ABNORMAL LOW (ref 3.5–5.0)
Alkaline Phosphatase: 56 U/L (ref 38–126)
Anion gap: 10 (ref 5–15)
BUN: 22 mg/dL (ref 8–23)
CO2: 26 mmol/L (ref 22–32)
Calcium: 8.4 mg/dL — ABNORMAL LOW (ref 8.9–10.3)
Chloride: 99 mmol/L (ref 98–111)
Creatinine, Ser: 0.68 mg/dL (ref 0.44–1.00)
GFR calc Af Amer: >60 mL/min (ref >60)
GFR calc non Af Amer: >60 mL/min (ref >60)
Glucose, Bld: 167 mg/dL — ABNORMAL HIGH (ref 70–99)
Potassium: 3.8 mmol/L (ref 3.5–5.1)
Sodium: 135 mmol/L (ref 135–145)
Total Bilirubin: 0.6 mg/dL (ref 0.3–1.2)
Total Protein: 6.1 g/dL — ABNORMAL LOW (ref 6.5–8.1)

## 2019-05-12 LAB — CBC WITH DIFFERENTIAL/PLATELET
Abs Immature Granulocytes: 0.14 10*3/uL — ABNORMAL HIGH (ref 0.00–0.07)
Basophils Absolute: 0 10*3/uL (ref 0.0–0.1)
Basophils Relative: 0 %
Eosinophils Absolute: 0 10*3/uL (ref 0.0–0.5)
Eosinophils Relative: 0 %
HCT: 37.7 % (ref 36.0–46.0)
Hemoglobin: 12.6 g/dL (ref 12.0–15.0)
Immature Granulocytes: 2 %
Lymphocytes Relative: 6 %
Lymphs Abs: 0.6 10*3/uL — ABNORMAL LOW (ref 0.7–4.0)
MCH: 31.3 pg (ref 26.0–34.0)
MCHC: 33.4 g/dL (ref 30.0–36.0)
MCV: 93.5 fL (ref 80.0–100.0)
Monocytes Absolute: 0.4 10*3/uL (ref 0.1–1.0)
Monocytes Relative: 5 %
Neutro Abs: 7.7 10*3/uL (ref 1.7–7.7)
Neutrophils Relative %: 87 %
Platelets: 405 10*3/uL — ABNORMAL HIGH (ref 150–400)
RBC: 4.03 MIL/uL (ref 3.87–5.11)
RDW: 12.5 % (ref 11.5–15.5)
WBC: 8.9 10*3/uL (ref 4.0–10.5)
nRBC: 0 % (ref 0.0–0.2)

## 2019-05-12 LAB — D-DIMER, QUANTITATIVE: D-Dimer, Quant: 0.56 ug/mL-FEU — ABNORMAL HIGH (ref 0.00–0.50)

## 2019-05-12 LAB — C-REACTIVE PROTEIN: CRP: 3.5 mg/dL — ABNORMAL HIGH (ref <1.0)

## 2019-05-12 NOTE — Progress Notes (Signed)
RN spoke with pt's daughter on the phone. Daily updates given. No further concerns

## 2019-05-12 NOTE — Plan of Care (Signed)
  Problem: Education: Goal: Knowledge of risk factors and measures for prevention of condition will improve Outcome: Progressing   Problem: Coping: Goal: Psychosocial and spiritual needs will be supported Outcome: Progressing   Problem: Respiratory: Goal: Will maintain a patent airway Outcome: Progressing Goal: Complications related to the disease process, condition or treatment will be avoided or minimized Outcome: Progressing   Problem: Education: Goal: Ability to state activities that reduce stress will improve Outcome: Progressing   Problem: Coping: Goal: Ability to identify and develop effective coping behavior will improve Outcome: Progressing   Problem: Self-Concept: Goal: Ability to identify factors that promote anxiety will improve Outcome: Progressing Goal: Level of anxiety will decrease Outcome: Progressing Goal: Ability to modify response to factors that promote anxiety will improve Outcome: Progressing   Problem: Education: Goal: Knowledge of General Education information will improve Description: Including pain rating scale, medication(s)/side effects and non-pharmacologic comfort measures Outcome: Progressing   Problem: Health Behavior/Discharge Planning: Goal: Ability to manage health-related needs will improve Outcome: Progressing   Problem: Clinical Measurements: Goal: Ability to maintain clinical measurements within normal limits will improve Outcome: Progressing Goal: Will remain free from infection Outcome: Progressing Goal: Diagnostic test results will improve Outcome: Progressing Goal: Respiratory complications will improve Outcome: Progressing Goal: Cardiovascular complication will be avoided Outcome: Progressing   Problem: Activity: Goal: Risk for activity intolerance will decrease Outcome: Progressing   Problem: Nutrition: Goal: Adequate nutrition will be maintained Outcome: Progressing   Problem: Coping: Goal: Level of anxiety  will decrease Outcome: Progressing   Problem: Elimination: Goal: Will not experience complications related to bowel motility Outcome: Progressing Goal: Will not experience complications related to urinary retention Outcome: Progressing   Problem: Pain Managment: Goal: General experience of comfort will improve Outcome: Progressing   Problem: Safety: Goal: Ability to remain free from injury will improve Outcome: Progressing   Problem: Skin Integrity: Goal: Risk for impaired skin integrity will decrease Outcome: Progressing

## 2019-05-12 NOTE — Progress Notes (Signed)
[facility] PROGRESS NOTE    Progress Note  Angelica Barnett  [id number] DOB: [birth year] DOA: 05/09/2019 PCP: Dion Body, MD     Brief Narrative:   Angelica Barnett is an 69 y.o. female past medical history of obesity, essential hypertension gout who presents to [facility] on 05/08/2019 for shortness of breath that started 2 days prior to admission.  Her SARS-CoV-2 PCR came back positive on 04/30/2018 on admission in the ED she was found with a temperature of 103 hypoxic and tachycardic, chest x-ray showed bilateral infiltrates CT angio of the chest was negative for PE but it did come for multifocal pneumonia, her hypoxia progressed she was given Tocilizumab on 05/08/2018 and she was started on IV steroids and remdesivir.  Assessment/Plan:   Acute respiratory failure due to COVID-19/  Pneumonia due to COVID-19 virus/now with severe ARDS due to SARS-CoV-2 infection: SARS-CoV-2 PCR positive on 05/01/2019. Procalcitonin on admission was undetectable. He is being weaned down on her oxygen, today she is on 8 L high flow nasal cannula, every night her oxygen is increased likely due to undiagnosed obstructive sleep apnea. Her inflammatory markers continue to improve nicely, continue IV remdesivir and steroids she is status post 2 doses of Actemra, continue vitamin C and zinc. Keep strict I's and O's, daily weights, try to keep the patient prone for at least 16 hours a day if not prone out of bed to chair.  Abnormal EKG: She denied any chest pain, cardiac biomarkers were negative 2D echo showed no wall motion abnormality.  Gout: Continue current home medications.  Essential hypertension: Pressure stable continue current home regimen.  Morbid obesity: With a BMI greater than 40 she has been counseled.   DVT prophylaxis: lovenox Family Communication:none Disposition Plan/Barrier to D/C: From home she will probably need to go home, will consult physical therapy. Code Status:      Code Status Orders  (From admission, onward)         Start     Ordered   05/09/19 1244  Full code  Continuous     05/09/19 1246        Code Status History    Date Active Date Inactive Code Status Order ID Comments User Context   05/08/2019 1242 05/09/2019 1230 Full Code [id number]  Para Skeans, MD ED   Advance Care Planning Activity    Advance Directive Documentation     Most Recent Value  Type of Advance Directive  Healthcare Power of Attorney  Pre-existing out of facility DNR order (yellow form or pink MOST form)  -  "MOST" Form in Place?  -        IV Access:    Peripheral IV   Procedures and diagnostic studies:   No results found.   Medical Consultants:    None.  Anti-Infectives:   IV remdesivir  Subjective:    Angelica Barnett relates her breathing is better than yesterday she did tell a difference, she has been proning.  Objective:    Vitals:   05/11/19 1200 05/11/19 1600 05/11/19 1958 05/12/19 0458  BP: 137/66 130/73 (!) 153/74 140/72  Pulse: 84 72 81 83  Resp:  (!) 24 (!) 23 (!) 26  Temp: 98.1 F (36.7 C) 97.7 F (36.5 C) 98.7 F (37.1 C) 98.3 F (36.8 C)  TempSrc: Oral Oral Oral Oral  SpO2: 93% 92% 97% 94%  Weight:      Height:       SpO2: 94 % O2 Flow Rate (  L/min): 8 L/min FiO2 (%): 100 %   Intake/Output Summary (Last 24 hours) at 05/12/2019 0717 Last data filed at 05/12/2019 0430 Gross per 24 hour  Intake 1560 ml  Output 1010 ml  Net 550 ml   Filed Weights   05/09/19 1756  Weight: 112.6 kg    Exam: General exam: In no acute distress. Respiratory system: Good air movement and clear to auscultation. Cardiovascular system: S1 & S2 heard, RRR. No JVD. Gastrointestinal system: Abdomen is nondistended, soft and nontender.  Extremities: No pedal edema. Skin: No rashes, lesions or ulcers Psychiatry: Judgement and insight appear normal. Mood & affect appropriate. Data Reviewed:    Labs: Basic Metabolic Panel: Recent Labs   Lab 05/08/19 1016 05/08/19 1016 05/08/19 1710 05/09/19 0551 05/09/19 0551 05/10/19 0220 05/10/19 0220 05/11/19 0255 05/12/19 0145  NA 136  --   --  136  --  137  --  136 135  K 3.8   < >  --  3.7   < > 3.7   < > 3.8 3.8  CL 96*  --   --  100  --  99  --  97* 99  CO2 28  --   --  26  --  25  --  28 26  GLUCOSE 104*  --   --  114*  --  163*  --  156* 167*  BUN 11  --   --  12  --  22  --  26* 22  CREATININE 0.93   < > 0.78 0.79  --  0.87  --  0.82 0.68  CALCIUM 8.6*  --   --  8.3*  --  8.5*  --  8.4* 8.4*   < > = values in this interval not displayed.   GFR Estimated Creatinine Clearance: 83.3 mL/min (by C-G formula based on SCr of 0.68 mg/dL). Liver Function Tests: Recent Labs  Lab 05/08/19 1016 05/09/19 0551 05/10/19 0220 05/11/19 0255 05/12/19 0145  AST 39 36 32 28 23  ALT 29 29 30 29 26   ALKPHOS 75 67 63 59 56  BILITOT 0.6 0.8 0.7 0.9 0.6  PROT 7.6 7.1 7.2 6.5 6.1*  ALBUMIN 3.6 3.4* 3.4* 3.1* 2.9*   No results for input(s): LIPASE, AMYLASE in the last 168 hours. No results for input(s): AMMONIA in the last 168 hours. Coagulation profile No results for input(s): INR, PROTIME in the last 168 hours. COVID-19 Labs  Recent Labs    05/10/19 0220 05/11/19 0255 05/12/19 0145  DDIMER 0.87* 0.48 0.56*  FERRITIN 372* 381*  --   CRP 15.7* 7.9* 3.5*    No results found for: SARSCOV2NAA  CBC: Recent Labs  Lab 05/08/19 0911 05/08/19 0911 05/08/19 1710 05/09/19 0551 05/10/19 0220 05/11/19 0255 05/12/19 0145  WBC 7.0   < > 7.0 7.8 4.2 8.1 8.9  NEUTROABS 5.8  --   --  6.6 3.5 7.0 7.7  HGB 14.2   < > 13.3 13.3 13.4 13.1 12.6  HCT 42.2   < > 39.8 40.1 40.9 38.7 37.7  MCV 94.4   < > 93.6 93.5 96.0 94.6 93.5  PLT 228   < > 220 240 267 314 405*   < > = values in this interval not displayed.   Cardiac Enzymes: No results for input(s): CKTOTAL, CKMB, CKMBINDEX, TROPONINI in the last 168 hours. BNP (last 3 results) No results for input(s): PROBNP in the last 8760  hours. CBG: No results for input(s): GLUCAP in the  last 168 hours. D-Dimer: Recent Labs    05/11/19 0255 05/12/19 0145  DDIMER 0.48 0.56*   Hgb A1c: No results for input(s): HGBA1C in the last 72 hours. Lipid Profile: No results for input(s): CHOL, HDL, LDLCALC, TRIG, CHOLHDL, LDLDIRECT in the last 72 hours. Thyroid function studies: No results for input(s): TSH, T4TOTAL, T3FREE, THYROIDAB in the last 72 hours.  Invalid input(s): FREET3 Anemia work up: Recent Labs    05/10/19 0220 05/11/19 0255  FERRITIN 372* 381*   Sepsis Labs: Recent Labs  Lab 05/08/19 0911 05/08/19 0911 05/08/19 1016 05/08/19 1710 05/08/19 1710 05/09/19 0551 05/10/19 0220 05/11/19 0255 05/12/19 0145  PROCALCITON  --   --  <0.10 <0.10  --   --   --   --   --   WBC 7.0   < >  --  7.0   < > 7.8 4.2 8.1 8.9  LATICACIDVEN 1.6  --   --   --   --   --   --   --   --    < > = values in this interval not displayed.   Microbiology Recent Results (from the past 240 hour(s))  Blood Culture (routine x 2)     Status: None (Preliminary result)   Collection Time: 05/08/19  9:11 AM   Specimen: BLOOD  Result Value Ref Range Status   Specimen Description BLOOD BLOOD LEFT HAND  Final   Special Requests   Final    BOTTLES DRAWN AEROBIC AND ANAEROBIC Blood Culture adequate volume   Culture   Final    NO GROWTH 4 DAYS Performed at [facility], [address] [place], [place] [address]    Report Status PENDING  Incomplete  Blood Culture (routine x 2)     Status: None (Preliminary result)   Collection Time: 05/08/19 10:16 AM   Specimen: BLOOD  Result Value Ref Range Status   Specimen Description BLOOD BLOOD LEFT HAND  Final   Special Requests   Final    BOTTLES DRAWN AEROBIC AND ANAEROBIC Blood Culture adequate volume   Culture   Final    NO GROWTH 4 DAYS Performed at [facility], [address], [place], [place] [address]    Report Status PENDING  Incomplete     Medications:    . allopurinol  300 mg Oral Daily  . vitamin C  500 mg Oral Daily  . calcium-vitamin D  1 tablet Oral Daily  . carvedilol  3.125 mg Oral BID  . enoxaparin (LOVENOX) injection  0.5 mg/kg Subcutaneous Daily  . loratadine  10 mg Oral Daily  . losartan  100 mg Oral Daily  . methylPREDNISolone (SOLU-MEDROL) injection  60 mg Intravenous Q8H  . sodium chloride flush  3 mL Intravenous Q12H  . zinc sulfate  220 mg Oral Daily   Continuous Infusions: . remdesivir 100 mg in NS 100 mL Stopped (05/11/19 2123)      LOS: 3 days   Charlynne Cousins  [facility]  05/12/2019, 7:17 AM

## 2019-05-13 LAB — CULTURE, BLOOD (ROUTINE X 2)
Culture: NO GROWTH
Culture: NO GROWTH
Special Requests: ADEQUATE
Special Requests: ADEQUATE

## 2019-05-13 LAB — C-REACTIVE PROTEIN: CRP: 1.5 mg/dL — ABNORMAL HIGH (ref <1.0)

## 2019-05-13 LAB — D-DIMER, QUANTITATIVE: D-Dimer, Quant: 0.69 ug/mL-FEU — ABNORMAL HIGH (ref 0.00–0.50)

## 2019-05-13 MED ORDER — PANTOPRAZOLE SODIUM 40 MG PO TBEC
40.0000 mg | DELAYED_RELEASE_TABLET | Freq: Two times a day (BID) | ORAL | Status: DC
  Administered 2019-05-13 – 2019-05-19 (×13): 40 mg via ORAL
  Filled 2019-05-13 (×14): qty 1

## 2019-05-13 MED ORDER — METHYLPREDNISOLONE SODIUM SUCC 40 MG IJ SOLR
40.0000 mg | Freq: Two times a day (BID) | INTRAMUSCULAR | Status: DC
  Administered 2019-05-13 – 2019-05-18 (×10): 40 mg via INTRAVENOUS
  Filled 2019-05-13 (×11): qty 1

## 2019-05-13 NOTE — Progress Notes (Signed)
RN spoke with daughter . Facetime pt. No further concerns

## 2019-05-13 NOTE — Progress Notes (Signed)
LATE ENTRY FOR 05/12/19 0945 AM  PHYSICAL THERAPY EVALUATION  HISTORY OF ILLNESS: 69 y.o. female with a history of obesity, HTN, and gout who presented to [facility] ED 1/24 with worsening shortness of breath for 2 days in the setting of having fatigue, cough, and fevers, chills, dyspnea, nausea since 1/17 when she was diagnosed with covid-19.  CLINICAL IMPRESSION: Pt admitted with above diagnosis. PTA lived home with spouse and was independent with all functional mobility, pt did not use any DME/AD at prior level and she denies having any recent falls. Pt currently with functional limitations due to the deficits listed below (see PT Problem List). During assessment pt did well with mobility but was noted to exhibits declines in independence, activity tolerance and also safety with mobility. Pt will benefit from skilled PT to increase their independence and safety with mobility to allow discharge to the venue listed below (home with no further PT follow up).       05/12/19 1200  PT Visit Information  Last PT Received On 05/12/19  Assistance Needed +1  History of Present Illness 69 y.o. female with a history of obesity, HTN, and gout who presented to [facility] ED 1/24 with worsening shortness of breath for 2 days in the setting of having fatigue, cough, and fevers, chills, dyspnea, nausea since 1/17 when she was diagnosed with covid-19.   Precautions  Precautions Fall  Restrictions  Weight Bearing Restrictions No  Home Living  Family/patient expects to be discharged to: Private residence  Living Arrangements Spouse/significant other  Available Help at Discharge Family  Type of [place] to enter  Entrance Stairs-Number of Steps 3  [place] - single point  Prior Function  Level of Independence Independent  Communication  Communication No difficulties  Pain Assessment  Pain Assessment Faces  Faces Pain Scale 4  Pain Location w/ coughing and breathing  Pain  Intervention(s) Limited activity within patient's tolerance;Monitored during session  Cognition  Arousal/Alertness Awake/alert  Behavior During Therapy [facility] for tasks assessed/performed  Overall Cognitive Status No family/caregiver present to determine baseline cognitive functioning  Upper Extremity Assessment  Upper Extremity Assessment Overall WFL for tasks assessed  Lower Extremity Assessment  Lower Extremity Assessment Overall WFL for tasks assessed  Cervical / Trunk Assessment  Cervical / Trunk Assessment Normal  Bed Mobility  Overal bed mobility Modified Independent  Transfers  Overall transfer level Needs assistance  Equipment used None  Transfers Sit to/from Stand;Stand Pivot Transfers  Sit to Stand Supervision;Min guard  Stand pivot transfers Supervision;Min guard  Ambulation/Gait  Ambulation/Gait assistance Min guard;Supervision  Gait Distance (Feet) 46 Feet  Assistive device None  Gait Pattern/deviations Step-through pattern;Wide base of support  General Gait Details ambulated 1 x 75ft with min guard assist, 1 x 57ft with min guard assist no AD on 10L/min, on HFNC noted min desat 83%  Balance  Overall balance assessment Mild deficits observed, not formally tested  Exercises  Exercises Other exercises  Other Exercises  Other Exercises incentive spirometer x 5  Other Exercises flutter valve x 5  PT - End of Session  Equipment Utilized During Treatment Oxygen  Activity Tolerance Patient limited by fatigue;Patient limited by lethargy;Treatment limited secondary to medical complications (Comment)  Patient left in chair;with call bell/phone within reach  Nurse Communication Mobility status  PT Assessment  PT Recommendation/Assessment Patient needs continued PT services  PT Visit Diagnosis Other abnormalities of gait and mobility (R26.89);Muscle weakness (generalized) (M62.81)  PT Problem List Decreased  activity tolerance;Decreased balance;Decreased mobility;Decreased  coordination;Decreased safety awareness  PT Plan  PT Frequency (ACUTE ONLY) Min 3X/week  PT Treatment/Interventions (ACUTE ONLY) Gait training;Functional mobility training;Therapeutic activities;Therapeutic exercise;Balance training;Neuromuscular re-education;Patient/family education  AM-PAC PT "6 Clicks" Mobility Outcome Measure (Version 2)  Help needed turning from your back to your side while in a flat bed without using bedrails? 4  Help needed moving from lying on your back to sitting on the side of a flat bed without using bedrails? 4  Help needed moving to and from a bed to a chair (including a wheelchair)? 3  Help needed standing up from a chair using your arms (e.g., wheelchair or bedside chair)? 3  Help needed to walk in hospital room? 3  Help needed climbing 3-5 steps with a railing?  2  6 Click Score 19  Consider Recommendation of Discharge To: Home with [facility]  PT Recommendation  Recommendations for Other Services OT consult  Follow Up Recommendations No PT follow up  PT equipment None recommended by PT  Individuals Consulted  Consulted and Agree with Results and Recommendations Patient  Acute Rehab PT Goals  Patient Stated Goal to go home  PT Goal Formulation With patient  Time For Goal Achievement 05/26/19  Potential to Achieve Goals Good  PT Time Calculation  PT Start Time (ACUTE ONLY) 0915  PT Stop Time (ACUTE ONLY) 0945  PT Time Calculation (min) (ACUTE ONLY) 30 min  PT General Charges  $$ ACUTE PT VISIT 1 Visit  PT Evaluation  $PT Eval Moderate Complexity 1 Mod  PT Treatments  $Therapeutic Activity 8-22 mins   Horald Chestnut, PT

## 2019-05-13 NOTE — Plan of Care (Signed)
  Problem: Education: Goal: Knowledge of risk factors and measures for prevention of condition will improve Outcome: Progressing   Problem: Coping: Goal: Psychosocial and spiritual needs will be supported Outcome: Progressing   Problem: Respiratory: Goal: Will maintain a patent airway Outcome: Progressing Goal: Complications related to the disease process, condition or treatment will be avoided or minimized Outcome: Progressing   Problem: Education: Goal: Ability to state activities that reduce stress will improve Outcome: Progressing   Problem: Coping: Goal: Ability to identify and develop effective coping behavior will improve Outcome: Progressing   Problem: Self-Concept: Goal: Ability to identify factors that promote anxiety will improve Outcome: Progressing Goal: Level of anxiety will decrease Outcome: Progressing Goal: Ability to modify response to factors that promote anxiety will improve Outcome: Progressing   Problem: Education: Goal: Knowledge of General Education information will improve Description: Including pain rating scale, medication(s)/side effects and non-pharmacologic comfort measures Outcome: Progressing   Problem: Health Behavior/Discharge Planning: Goal: Ability to manage health-related needs will improve Outcome: Progressing   Problem: Clinical Measurements: Goal: Ability to maintain clinical measurements within normal limits will improve Outcome: Progressing Goal: Will remain free from infection Outcome: Progressing Goal: Diagnostic test results will improve Outcome: Progressing Goal: Respiratory complications will improve Outcome: Progressing Goal: Cardiovascular complication will be avoided Outcome: Progressing   Problem: Activity: Goal: Risk for activity intolerance will decrease Outcome: Progressing   Problem: Nutrition: Goal: Adequate nutrition will be maintained Outcome: Progressing   Problem: Coping: Goal: Level of anxiety  will decrease Outcome: Progressing   Problem: Elimination: Goal: Will not experience complications related to bowel motility Outcome: Progressing Goal: Will not experience complications related to urinary retention Outcome: Progressing   Problem: Pain Managment: Goal: General experience of comfort will improve Outcome: Progressing   Problem: Safety: Goal: Ability to remain free from injury will improve Outcome: Progressing   Problem: Skin Integrity: Goal: Risk for impaired skin integrity will decrease Outcome: Progressing

## 2019-05-13 NOTE — Progress Notes (Signed)
[facility] PROGRESS NOTE    Progress Note  Angelica Barnett  [id number] DOB: 10-29-1950 DOA: 05/09/2019 PCP: Dion Body, MD     Brief Narrative:   Angelica Barnett is an 69 y.o. female past medical history of obesity, essential hypertension gout who presents to [facility] on 05/08/2019 for shortness of breath that started 2 days prior to admission.  Her SARS-CoV-2 PCR came back positive on 04/30/2018 on admission in the ED she was found with a temperature of 103 hypoxic and tachycardic, chest x-ray showed bilateral infiltrates CT angio of the chest was negative for PE but it did come for multifocal pneumonia, her hypoxia progressed she was given Tocilizumab on 05/08/2018 and she was started on IV steroids and remdesivir.  Assessment/Plan:   Acute respiratory failure due to COVID-19/  Pneumonia due to COVID-19 virus/now with severe ARDS due to SARS-CoV-2 infection: She is currently requiring 8 L of oxygen to keep saturations greater than 92%.  Overnight she desatted probably has undiagnosed obstructive sleep apnea. Her inflammatory markers continue to improve, she will complete her course of IV remdesivir today, will decrease steroids.  She status post 2 dose of Actemra, continue vitamin C and zinc. Try to keep the patient prone for at least 16 hours a day if not prone out of bed to chair, continue strict I's and O's and daily weights. Encouraged patient to continue to do spirometry and flutter valve.  She relates she does not feel as good as yesterday today.  Abnormal EKG: She denied any chest pain, cardiac biomarkers were negative 2D echo showed no wall motion abnormality.  Gout: Continue current home medications.  Essential hypertension: Pressure stable continue current home regimen.  Morbid obesity: With a BMI greater than 40 she has been counseled.   DVT prophylaxis: lovenox Family Communication:none Disposition Plan/Barrier to D/C: From home she will probably need to  go home, will consult physical therapy. Code Status:     Code Status Orders  (From admission, onward)         Start     Ordered   05/09/19 1244  Full code  Continuous     05/09/19 1246        Code Status History    Date Active Date Inactive Code Status Order ID Comments User Context   05/08/2019 1242 05/09/2019 1230 Full Code [id number]  Para Skeans, MD ED   Advance Care Planning Activity    Advance Directive Documentation     Most Recent Value  Type of Advance Directive  Healthcare Power of Attorney  Pre-existing out of facility DNR order (yellow form or pink MOST form)  -  "MOST" Form in Place?  -        IV Access:    Peripheral IV   Procedures and diagnostic studies:   No results found.   Medical Consultants:    None.  Anti-Infectives:   IV remdesivir  Subjective:    Angelica Barnett relates her breathing and her overall status does not feel as good as she did yesterday.  Objective:    Vitals:   05/12/19 1600 05/12/19 1914 05/12/19 2343 05/13/19 0402  BP: 129/79 (!) 143/68 140/75   Pulse: 79 81 82 76  Resp: 19 12  20   Temp: 98 F (36.7 C) 98.7 F (37.1 C) 98.4 F (36.9 C) 98.2 F (36.8 C)  TempSrc:  Oral Oral Oral  SpO2: (!) 88% 90% 97% 98%  Weight:      Height:  SpO2: 98 % O2 Flow Rate (L/min): 8 L/min FiO2 (%): 100 %   Intake/Output Summary (Last 24 hours) at 05/13/2019 0717 Last data filed at 05/12/2019 1914 Gross per 24 hour  Intake -  Output 0 ml  Net 0 ml   Filed Weights   05/09/19 1756  Weight: 112.6 kg    Exam: General exam: In no acute distress. Respiratory system: Good air movement and clear to auscultation. Cardiovascular system: S1 & S2 heard, RRR. No JVD. Gastrointestinal system: Abdomen is nondistended, soft and nontender.  Extremities: No pedal edema. Skin: No rashes, lesions or ulcers  Data Reviewed:    Labs: Basic Metabolic Panel: Recent Labs  Lab 05/08/19 1016 05/08/19 1016 05/08/19 1710  05/09/19 0551 05/09/19 0551 05/10/19 0220 05/10/19 0220 05/11/19 0255 05/12/19 0145  NA 136  --   --  136  --  137  --  136 135  K 3.8   < >  --  3.7   < > 3.7   < > 3.8 3.8  CL 96*  --   --  100  --  99  --  97* 99  CO2 28  --   --  26  --  25  --  28 26  GLUCOSE 104*  --   --  114*  --  163*  --  156* 167*  BUN 11  --   --  12  --  22  --  26* 22  CREATININE 0.93   < > 0.78 0.79  --  0.87  --  0.82 0.68  CALCIUM 8.6*  --   --  8.3*  --  8.5*  --  8.4* 8.4*   < > = values in this interval not displayed.   GFR Estimated Creatinine Clearance: 83.3 mL/min (by C-G formula based on SCr of 0.68 mg/dL). Liver Function Tests: Recent Labs  Lab 05/08/19 1016 05/09/19 0551 05/10/19 0220 05/11/19 0255 05/12/19 0145  AST 39 36 32 28 23  ALT 29 29 30 29 26   ALKPHOS 75 67 63 59 56  BILITOT 0.6 0.8 0.7 0.9 0.6  PROT 7.6 7.1 7.2 6.5 6.1*  ALBUMIN 3.6 3.4* 3.4* 3.1* 2.9*   No results for input(s): LIPASE, AMYLASE in the last 168 hours. No results for input(s): AMMONIA in the last 168 hours. Coagulation profile No results for input(s): INR, PROTIME in the last 168 hours. COVID-19 Labs  Recent Labs    05/11/19 0255 05/12/19 0145 05/13/19 0255  DDIMER 0.48 0.56* 0.69*  FERRITIN 381*  --   --   CRP 7.9* 3.5* 1.5*    No results found for: SARSCOV2NAA  CBC: Recent Labs  Lab 05/08/19 0911 05/08/19 0911 05/08/19 1710 05/09/19 0551 05/10/19 0220 05/11/19 0255 05/12/19 0145  WBC 7.0   < > 7.0 7.8 4.2 8.1 8.9  NEUTROABS 5.8  --   --  6.6 3.5 7.0 7.7  HGB 14.2   < > 13.3 13.3 13.4 13.1 12.6  HCT 42.2   < > 39.8 40.1 40.9 38.7 37.7  MCV 94.4   < > 93.6 93.5 96.0 94.6 93.5  PLT 228   < > 220 240 267 314 405*   < > = values in this interval not displayed.   Cardiac Enzymes: No results for input(s): CKTOTAL, CKMB, CKMBINDEX, TROPONINI in the last 168 hours. BNP (last 3 results) No results for input(s): PROBNP in the last 8760 hours. CBG: No results for input(s): GLUCAP in  the last 168  hours. D-Dimer: Recent Labs    05/12/19 0145 05/13/19 0255  DDIMER 0.56* 0.69*   Hgb A1c: No results for input(s): HGBA1C in the last 72 hours. Lipid Profile: No results for input(s): CHOL, HDL, LDLCALC, TRIG, CHOLHDL, LDLDIRECT in the last 72 hours. Thyroid function studies: No results for input(s): TSH, T4TOTAL, T3FREE, THYROIDAB in the last 72 hours.  Invalid input(s): FREET3 Anemia work up: Recent Labs    05/11/19 0255  FERRITIN 381*   Sepsis Labs: Recent Labs  Lab 05/08/19 0911 05/08/19 0911 05/08/19 1016 05/08/19 1710 05/08/19 1710 05/09/19 0551 05/10/19 0220 05/11/19 0255 05/12/19 0145  PROCALCITON  --   --  <0.10 <0.10  --   --   --   --   --   WBC 7.0   < >  --  7.0   < > 7.8 4.2 8.1 8.9  LATICACIDVEN 1.6  --   --   --   --   --   --   --   --    < > = values in this interval not displayed.   Microbiology Recent Results (from the past 240 hour(s))  Blood Culture (routine x 2)     Status: None   Collection Time: 05/08/19  9:11 AM   Specimen: BLOOD  Result Value Ref Range Status   Specimen Description BLOOD BLOOD LEFT HAND  Final   Special Requests   Final    BOTTLES DRAWN AEROBIC AND ANAEROBIC Blood Culture adequate volume   Culture   Final    NO GROWTH 5 DAYS Performed at [facility], [address], [place], [place] [address]    Report Status 05/13/2019 FINAL  Final  Blood Culture (routine x 2)     Status: None   Collection Time: 05/08/19 10:16 AM   Specimen: BLOOD  Result Value Ref Range Status   Specimen Description BLOOD BLOOD LEFT HAND  Final   Special Requests   Final    BOTTLES DRAWN AEROBIC AND ANAEROBIC Blood Culture adequate volume   Culture   Final    NO GROWTH 5 DAYS Performed at [facility], [address], [place], [place] [address]    Report Status 05/13/2019 FINAL  Final     Medications:   . allopurinol  300 mg Oral Daily  . vitamin C  500 mg Oral Daily  . calcium-vitamin D  1 tablet  Oral Daily  . carvedilol  3.125 mg Oral BID  . enoxaparin (LOVENOX) injection  0.5 mg/kg Subcutaneous Daily  . loratadine  10 mg Oral Daily  . losartan  100 mg Oral Daily  . methylPREDNISolone (SOLU-MEDROL) injection  60 mg Intravenous Q8H  . sodium chloride flush  3 mL Intravenous Q12H  . zinc sulfate  220 mg Oral Daily   Continuous Infusions:     LOS: 4 days   Charlynne Cousins  [facility]  05/13/2019, 7:17 AM

## 2019-05-14 ENCOUNTER — Inpatient Hospital Stay (HOSPITAL_COMMUNITY): Payer: Medicare Other

## 2019-05-14 LAB — CBC WITH DIFFERENTIAL/PLATELET
Abs Immature Granulocytes: 0.28 10*3/uL — ABNORMAL HIGH (ref 0.00–0.07)
Basophils Absolute: 0 10*3/uL (ref 0.0–0.1)
Basophils Relative: 0 %
Eosinophils Absolute: 0 10*3/uL (ref 0.0–0.5)
Eosinophils Relative: 0 %
HCT: 39 % (ref 36.0–46.0)
Hemoglobin: 12.8 g/dL (ref 12.0–15.0)
Immature Granulocytes: 3 %
Lymphocytes Relative: 5 %
Lymphs Abs: 0.4 10*3/uL — ABNORMAL LOW (ref 0.7–4.0)
MCH: 31.8 pg (ref 26.0–34.0)
MCHC: 32.8 g/dL (ref 30.0–36.0)
MCV: 96.8 fL (ref 80.0–100.0)
Monocytes Absolute: 0.4 10*3/uL (ref 0.1–1.0)
Monocytes Relative: 4 %
Neutro Abs: 7.8 10*3/uL — ABNORMAL HIGH (ref 1.7–7.7)
Neutrophils Relative %: 88 %
Platelets: 411 10*3/uL — ABNORMAL HIGH (ref 150–400)
RBC: 4.03 MIL/uL (ref 3.87–5.11)
RDW: 12.8 % (ref 11.5–15.5)
WBC: 8.8 10*3/uL (ref 4.0–10.5)
nRBC: 0 % (ref 0.0–0.2)

## 2019-05-14 LAB — BASIC METABOLIC PANEL
Anion gap: 10 (ref 5–15)
BUN: 22 mg/dL (ref 8–23)
CO2: 28 mmol/L (ref 22–32)
Calcium: 8.2 mg/dL — ABNORMAL LOW (ref 8.9–10.3)
Chloride: 97 mmol/L — ABNORMAL LOW (ref 98–111)
Creatinine, Ser: 0.75 mg/dL (ref 0.44–1.00)
GFR calc Af Amer: >60 mL/min (ref >60)
GFR calc non Af Amer: >60 mL/min (ref >60)
Glucose, Bld: 145 mg/dL — ABNORMAL HIGH (ref 70–99)
Potassium: 4.4 mmol/L (ref 3.5–5.1)
Sodium: 135 mmol/L (ref 135–145)

## 2019-05-14 LAB — C-REACTIVE PROTEIN: CRP: 0.8 mg/dL (ref <1.0)

## 2019-05-14 LAB — PROCALCITONIN: Procalcitonin: 0.11 ng/mL

## 2019-05-14 LAB — D-DIMER, QUANTITATIVE: D-Dimer, Quant: 0.93 ug/mL-FEU — ABNORMAL HIGH (ref 0.00–0.50)

## 2019-05-14 MED ORDER — FUROSEMIDE 10 MG/ML IJ SOLN: 20.0000 mg | Freq: Once | INTRAMUSCULAR | Status: DC

## 2019-05-14 MED ORDER — FUROSEMIDE 10 MG/ML IJ SOLN: 40.0000 mg | Freq: Once | INTRAMUSCULAR | Status: DC

## 2019-05-14 MED ORDER — FUROSEMIDE 10 MG/ML IJ SOLN
20.0000 mg | Freq: Once | INTRAMUSCULAR | Status: AC
  Administered 2019-05-14: 15:00:00 20 mg via INTRAVENOUS
  Filled 2019-05-14: qty 2

## 2019-05-14 MED ORDER — FUROSEMIDE 10 MG/ML IJ SOLN
20.0000 mg | Freq: Once | INTRAMUSCULAR | Status: AC
  Administered 2019-05-14: 10:00:00 20 mg via INTRAVENOUS
  Filled 2019-05-14: qty 2

## 2019-05-14 NOTE — Progress Notes (Signed)
RN notified MD that pt has left  Lower lobe crackles and is requiring more O2.. Patient was proned all night. Orders received. RN spoke with daughter Maudie Mercury, daily updates given. Will continuo to monitor.

## 2019-05-14 NOTE — Progress Notes (Signed)
[facility] PROGRESS NOTE    Progress Note  Angelica Barnett  [id number] DOB: 1950/04/27 DOA: 05/09/2019 PCP: Dion Body, MD     Brief Narrative:   Angelica Barnett is an 69 y.o. female past medical history of obesity, essential hypertension gout who presents to [facility] on 05/08/2019 for shortness of breath that started 2 days prior to admission.  Her SARS-CoV-2 PCR came back positive on 04/30/2018 on admission in the ED she was found with a temperature of 103 hypoxic and tachycardic, chest x-ray showed bilateral infiltrates CT angio of the chest was negative for PE but it did come for multifocal pneumonia, her hypoxia progressed she was given Tocilizumab on 05/08/2018 and she was started on IV steroids and remdesivir.  Assessment/Plan:   Acute respiratory failure due to COVID-19/  Pneumonia due to COVID-19 virus/now with severe ARDS due to SARS-CoV-2 infection: She is currently on 15 L of oxygen to keep saturations greater than 92%.  Overnight she desaturates probably she has undiagnosed obstructive sleep apnea. Inflammatory markers are significantly improve, she complete her course of IV remdesivir, will continue to wean down her steroids but will continue them for 10 days.  Her inflammatory markers are significantly improved.  She did receive 2 doses of Actemra, continue vitamin C and zinc. Strict I's and O's and daily weights try to keep the patient prone for early 16 hours a day if not prone out of bed to chair, encourage incentive spirometry and flutter valve. She is now gone to 15 L, with a new productive sputum, has remained afebrile and leukocytosis, but she does have a left shift. Check a procalcitonin and a chest x-ray  Abnormal EKG: She denied any chest pain, cardiac biomarkers were negative 2D echo showed no wall motion abnormality.  Gout: Continue current home medications.  Essential hypertension: Pressure stable continue current home regimen.  Morbid  obesity: With a BMI greater than 40 she has been counseled.   DVT prophylaxis: lovenox Family Communication:none Disposition Plan/Barrier to D/C: From home she will probably  go home, will consult physical therapy. Code Status:     Code Status Orders  (From admission, onward)         Start     Ordered   05/09/19 1244  Full code  Continuous     05/09/19 1246        Code Status History    Date Active Date Inactive Code Status Order ID Comments User Context   05/08/2019 1242 05/09/2019 1230 Full Code [id number]  Para Skeans, MD ED   Advance Care Planning Activity    Advance Directive Documentation     Most Recent Value  Type of Advance Directive  Healthcare Power of Attorney  Pre-existing out of facility DNR order (yellow form or pink MOST form)  --  "MOST" Form in Place?  --        IV Access:    Peripheral IV   Procedures and diagnostic studies:   No results found.   Medical Consultants:    None.  Anti-Infectives:   IV remdesivir  Subjective:    Angelica Barnett she relates her breathing is about the same, she does relates a productive cough not anorexic  Objective:    Vitals:   05/13/19 1200 05/13/19 1600 05/13/19 2007 05/14/19 0400  BP: 127/85 (!) 118/59 129/65 (!) 146/70  Pulse: 81 75 77 72  Resp: 19 18  16   Temp: 98 F (36.7 C) 98 F (36.7 C) 98.4 F (  36.9 C) 98.8 F (37.1 C)  TempSrc:   Oral Oral  SpO2: (!) 89% (!) 89% 90% 95%  Weight:      Height:       SpO2: 95 % O2 Flow Rate (L/min): 7 L/min FiO2 (%): 100 %   Intake/Output Summary (Last 24 hours) at 05/14/2019 0706 Last data filed at 05/13/2019 1922 Gross per 24 hour  Intake --  Output 0 ml  Net 0 ml   Filed Weights   05/09/19 1756  Weight: 112.6 kg    Exam: General exam: In no acute distress. Respiratory system: Good air movement and clear to auscultation. Cardiovascular system: S1 & S2 heard, RRR. No JVD. Gastrointestinal system: Abdomen is nondistended, soft and  nontender.  Extremities: No pedal edema. Skin: No rashes, lesions or ulcers  Data Reviewed:    Labs: Basic Metabolic Panel: Recent Labs  Lab 05/08/19 1016 05/08/19 1016 05/08/19 1710 05/09/19 0551 05/09/19 0551 05/10/19 0220 05/10/19 0220 05/11/19 0255 05/12/19 0145  NA 136  --   --  136  --  137  --  136 135  K 3.8   < >  --  3.7   < > 3.7   < > 3.8 3.8  CL 96*  --   --  100  --  99  --  97* 99  CO2 28  --   --  26  --  25  --  28 26  GLUCOSE 104*  --   --  114*  --  163*  --  156* 167*  BUN 11  --   --  12  --  22  --  26* 22  CREATININE 0.93   < > 0.78 0.79  --  0.87  --  0.82 0.68  CALCIUM 8.6*  --   --  8.3*  --  8.5*  --  8.4* 8.4*   < > = values in this interval not displayed.   GFR Estimated Creatinine Clearance: 83.3 mL/min (by C-G formula based on SCr of 0.68 mg/dL). Liver Function Tests: Recent Labs  Lab 05/08/19 1016 05/09/19 0551 05/10/19 0220 05/11/19 0255 05/12/19 0145  AST 39 36 32 28 23  ALT 29 29 30 29 26   ALKPHOS 75 67 63 59 56  BILITOT 0.6 0.8 0.7 0.9 0.6  PROT 7.6 7.1 7.2 6.5 6.1*  ALBUMIN 3.6 3.4* 3.4* 3.1* 2.9*   No results for input(s): LIPASE, AMYLASE in the last 168 hours. No results for input(s): AMMONIA in the last 168 hours. Coagulation profile No results for input(s): INR, PROTIME in the last 168 hours. COVID-19 Labs  Recent Labs    05/12/19 0145 05/13/19 0255 05/14/19 0251  DDIMER 0.56* 0.69* 0.93*  CRP 3.5* 1.5* 0.8    No results found for: SARSCOV2NAA  CBC: Recent Labs  Lab 05/08/19 0911 05/08/19 0911 05/08/19 1710 05/09/19 0551 05/10/19 0220 05/11/19 0255 05/12/19 0145  WBC 7.0   < > 7.0 7.8 4.2 8.1 8.9  NEUTROABS 5.8  --   --  6.6 3.5 7.0 7.7  HGB 14.2   < > 13.3 13.3 13.4 13.1 12.6  HCT 42.2   < > 39.8 40.1 40.9 38.7 37.7  MCV 94.4   < > 93.6 93.5 96.0 94.6 93.5  PLT 228   < > 220 240 267 314 405*   < > = values in this interval not displayed.   Cardiac Enzymes: No results for input(s): CKTOTAL,  CKMB, CKMBINDEX, TROPONINI in the last 168  hours. BNP (last 3 results) No results for input(s): PROBNP in the last 8760 hours. CBG: No results for input(s): GLUCAP in the last 168 hours. D-Dimer: Recent Labs    05/13/19 0255 05/14/19 0251  DDIMER 0.69* 0.93*   Hgb A1c: No results for input(s): HGBA1C in the last 72 hours. Lipid Profile: No results for input(s): CHOL, HDL, LDLCALC, TRIG, CHOLHDL, LDLDIRECT in the last 72 hours. Thyroid function studies: No results for input(s): TSH, T4TOTAL, T3FREE, THYROIDAB in the last 72 hours.  Invalid input(s): FREET3 Anemia work up: No results for input(s): VITAMINB12, FOLATE, FERRITIN, TIBC, IRON, RETICCTPCT in the last 72 hours. Sepsis Labs: Recent Labs  Lab 05/08/19 0911 05/08/19 0911 05/08/19 1016 05/08/19 1710 05/08/19 1710 05/09/19 0551 05/10/19 0220 05/11/19 0255 05/12/19 0145  PROCALCITON  --   --  <0.10 <0.10  --   --   --   --   --   WBC 7.0   < >  --  7.0   < > 7.8 4.2 8.1 8.9  LATICACIDVEN 1.6  --   --   --   --   --   --   --   --    < > = values in this interval not displayed.   Microbiology Recent Results (from the past 240 hour(s))  Blood Culture (routine x 2)     Status: None   Collection Time: 05/08/19  9:11 AM   Specimen: BLOOD  Result Value Ref Range Status   Specimen Description BLOOD BLOOD LEFT HAND  Final   Special Requests   Final    BOTTLES DRAWN AEROBIC AND ANAEROBIC Blood Culture adequate volume   Culture   Final    NO GROWTH 5 DAYS Performed at [facility], [address], [place], [place] [address]    Report Status 05/13/2019 FINAL  Final  Blood Culture (routine x 2)     Status: None   Collection Time: 05/08/19 10:16 AM   Specimen: BLOOD  Result Value Ref Range Status   Specimen Description BLOOD BLOOD LEFT HAND  Final   Special Requests   Final    BOTTLES DRAWN AEROBIC AND ANAEROBIC Blood Culture adequate volume   Culture   Final    NO GROWTH 5 DAYS Performed at [facility], [address], [place], [place] [address]    Report Status 05/13/2019 FINAL  Final     Medications:   . allopurinol  300 mg Oral Daily  . vitamin C  500 mg Oral Daily  . calcium-vitamin D  1 tablet Oral Daily  . carvedilol  3.125 mg Oral BID  . enoxaparin (LOVENOX) injection  0.5 mg/kg Subcutaneous Daily  . loratadine  10 mg Oral Daily  . losartan  100 mg Oral Daily  . methylPREDNISolone (SOLU-MEDROL) injection  40 mg Intravenous Q12H  . pantoprazole  40 mg Oral BID  . sodium chloride flush  3 mL Intravenous Q12H  . zinc sulfate  220 mg Oral Daily   Continuous Infusions:     LOS: 5 days   Charlynne Cousins  [facility]  05/14/2019, 7:06 AM

## 2019-05-15 ENCOUNTER — Inpatient Hospital Stay (HOSPITAL_COMMUNITY): Payer: Medicare Other

## 2019-05-15 LAB — BASIC METABOLIC PANEL
Anion gap: 12 (ref 5–15)
BUN: 22 mg/dL (ref 8–23)
CO2: 29 mmol/L (ref 22–32)
Calcium: 8.2 mg/dL — ABNORMAL LOW (ref 8.9–10.3)
Chloride: 95 mmol/L — ABNORMAL LOW (ref 98–111)
Creatinine, Ser: 0.7 mg/dL (ref 0.44–1.00)
GFR calc Af Amer: >60 mL/min (ref >60)
GFR calc non Af Amer: >60 mL/min (ref >60)
Glucose, Bld: 123 mg/dL — ABNORMAL HIGH (ref 70–99)
Potassium: 4.3 mmol/L (ref 3.5–5.1)
Sodium: 136 mmol/L (ref 135–145)

## 2019-05-15 LAB — CBC WITH DIFFERENTIAL/PLATELET
Abs Immature Granulocytes: 0.25 10*3/uL — ABNORMAL HIGH (ref 0.00–0.07)
Basophils Absolute: 0 10*3/uL (ref 0.0–0.1)
Basophils Relative: 0 %
Eosinophils Absolute: 0 10*3/uL (ref 0.0–0.5)
Eosinophils Relative: 0 %
HCT: 39.8 % (ref 36.0–46.0)
Hemoglobin: 13.2 g/dL (ref 12.0–15.0)
Immature Granulocytes: 2 %
Lymphocytes Relative: 5 %
Lymphs Abs: 0.5 10*3/uL — ABNORMAL LOW (ref 0.7–4.0)
MCH: 31.8 pg (ref 26.0–34.0)
MCHC: 33.2 g/dL (ref 30.0–36.0)
MCV: 95.9 fL (ref 80.0–100.0)
Monocytes Absolute: 0.4 10*3/uL (ref 0.1–1.0)
Monocytes Relative: 4 %
Neutro Abs: 9.7 10*3/uL — ABNORMAL HIGH (ref 1.7–7.7)
Neutrophils Relative %: 89 %
Platelets: 400 10*3/uL (ref 150–400)
RBC: 4.15 MIL/uL (ref 3.87–5.11)
RDW: 12.6 % (ref 11.5–15.5)
WBC: 10.9 10*3/uL — ABNORMAL HIGH (ref 4.0–10.5)
nRBC: 0 % (ref 0.0–0.2)

## 2019-05-15 LAB — PROCALCITONIN: Procalcitonin: <0.1 ng/mL

## 2019-05-15 MED ORDER — FUROSEMIDE 10 MG/ML IJ SOLN
40.0000 mg | Freq: Once | INTRAMUSCULAR | Status: AC
  Administered 2019-05-15: 09:00:00 40 mg via INTRAVENOUS
  Filled 2019-05-15: qty 4

## 2019-05-15 NOTE — Progress Notes (Signed)
Pt. Went for a CT scan with RN. Back to her room. RN spoke with pt.s daughter on the phone. Will continuo to monitor

## 2019-05-15 NOTE — Progress Notes (Signed)
[facility] PROGRESS NOTE    Progress Note  Angelica Barnett  [id number] DOB: 30-Aug-1950 DOA: 05/09/2019 PCP: Dion Body, MD     Brief Narrative:   Angelica Barnett is an 69 y.o. female past medical history of obesity, essential hypertension gout who presents to [facility] on 05/08/2019 for shortness of breath that started 2 days prior to admission.  Her SARS-CoV-2 PCR came back positive on 04/30/2018 on admission in the ED she was found with a temperature of 103 hypoxic and tachycardic, chest x-ray showed bilateral infiltrates CT angio of the chest was negative for PE but it did come for multifocal pneumonia, her hypoxia progressed she was given Tocilizumab on 05/08/2018 and she was started on IV steroids and remdesivir.  Assessment/Plan:   Acute respiratory failure due to COVID-19/  Pneumonia due to COVID-19 virus/now with severe ARDS due to SARS-CoV-2 infection: She is on 10 L of oxygen to keep saturations greater than 92%.  Her inflammatory markers including her D-dimer is improved.  There is improvement in 15 L from the previous day. She has completed her course of IV remdesivir, will continue IV steroids for total of 10 days. She received 2 doses of Actemra continue vitamin C and zinc. Continue strict I's and O's and daily weights, try to keep the patient prone for at least 16 hours a day if not prone out of bed to chair, continue incentive spirometry and flutter valve. She has remained afebrile, her procalcitonin is 0.1, she has no leukocytosis but a mild left shift, chest x-ray is unchanged.  We will check a CT of the chest  Abnormal EKG: She continues to denies any chest pain cardiac biomarkers have been negative 2D echo showed no wall motion abnormality.  Gout: Continue current home medications.  Essential hypertension: Pressure stable continue current home regimen.  Morbid obesity: With a BMI greater than 40 she has been counseled.   DVT prophylaxis: lovenox  Family Communication:none Disposition Plan/Barrier to D/C: From home she will probably  go home. Code Status:     Code Status Orders  (From admission, onward)         Start     Ordered   05/09/19 1244  Full code  Continuous     05/09/19 1246        Code Status History    Date Active Date Inactive Code Status Order ID Comments User Context   05/08/2019 1242 05/09/2019 1230 Full Code [id number]  Para Skeans, MD ED   Advance Care Planning Activity    Advance Directive Documentation     Most Recent Value  Type of Advance Directive  Healthcare Power of [place]  Pre-existing out of facility DNR order (yellow form or pink MOST form)  -  "MOST" Form in Place?  -        IV Access:    Peripheral IV   Procedures and diagnostic studies:   DG CHEST PORT 1 VIEW  Result Date: 05/14/2019 CLINICAL DATA:  Dyspnea. EXAM: PORTABLE CHEST 1 VIEW COMPARISON:  05/09/2019 FINDINGS: Lungs are adequately inflated demonstrate patchy hazy bilateral airspace opacification over the mid to lower lungs without significant change likely multifocal pneumonia. No evidence of effusion. Cardiomediastinal silhouette and remainder of the exam is unchanged. IMPRESSION: Stable hazy patchy multifocal airspace process over the mid to lower lungs likely multifocal pneumonia. Electronically Signed   By: Marin Olp M.D.   On: 05/14/2019 10:22     Medical Consultants:    None.  Anti-Infectives:  IV remdesivir  Subjective:    [name] she relates her breathing is better than yesterday she denies any chest pain.  Objective:    Vitals:   05/14/19 1636 05/14/19 1936 05/15/19 0105 05/15/19 0316  BP: 123/75 131/70 (!) 147/73 128/67  Pulse: 84 81 76 88  Resp: 20  18 18   Temp: 98.1 F (36.7 C) 98 F (36.7 C) 97.8 F (36.6 C) 98.5 F (36.9 C)  TempSrc: Oral Oral Oral Oral  SpO2: 91% 90% 91% 90%  Weight:      Height:       SpO2: 90 % O2 Flow Rate (L/min): 10 L/min FiO2 (%): 100 %    Intake/Output Summary (Last 24 hours) at 05/15/2019 0719 Last data filed at 05/15/2019 0500 Gross per 24 hour  Intake 1200 ml  Output 2750 ml  Net -1550 ml   Filed Weights   05/09/19 1756  Weight: 112.6 kg    Exam: General exam: In no acute distress. Respiratory system: Good air movement and clear to auscultation. Cardiovascular system: S1 & S2 heard, RRR. No JVD. Gastrointestinal system: Abdomen is nondistended, soft and nontender.  Skin: No rashes, lesions or ulcers  Data Reviewed:    Labs: Basic Metabolic Panel: Recent Labs  Lab 05/09/19 0551 05/09/19 0551 05/10/19 0220 05/10/19 0220 05/11/19 0255 05/11/19 0255 05/12/19 0145 05/14/19 0251  NA 136  --  137  --  136  --  135 135  K 3.7   < > 3.7   < > 3.8   < > 3.8 4.4  CL 100  --  99  --  97*  --  99 97*  CO2 26  --  25  --  28  --  26 28  GLUCOSE 114*  --  163*  --  156*  --  167* 145*  BUN 12  --  22  --  26*  --  22 22  CREATININE 0.79  --  0.87  --  0.82  --  0.68 0.75  CALCIUM 8.3*  --  8.5*  --  8.4*  --  8.4* 8.2*   < > = values in this interval not displayed.   GFR Estimated Creatinine Clearance: 83.3 mL/min (by C-G formula based on SCr of 0.75 mg/dL). Liver Function Tests: Recent Labs  Lab 05/08/19 1016 05/09/19 0551 05/10/19 0220 05/11/19 0255 05/12/19 0145  AST 39 36 32 28 23  ALT 29 29 30 29 26   ALKPHOS 75 67 63 59 56  BILITOT 0.6 0.8 0.7 0.9 0.6  PROT 7.6 7.1 7.2 6.5 6.1*  ALBUMIN 3.6 3.4* 3.4* 3.1* 2.9*   No results for input(s): LIPASE, AMYLASE in the last 168 hours. No results for input(s): AMMONIA in the last 168 hours. Coagulation profile No results for input(s): INR, PROTIME in the last 168 hours. COVID-19 Labs  Recent Labs    05/13/19 0255 05/14/19 0251  DDIMER 0.69* 0.93*  CRP 1.5* 0.8    No results found for: SARSCOV2NAA  CBC: Recent Labs  Lab 05/09/19 0551 05/10/19 0220 05/11/19 0255 05/12/19 0145 05/14/19 0251  WBC 7.8 4.2 8.1 8.9 8.8  NEUTROABS 6.6 3.5 7.0  7.7 7.8*  HGB 13.3 13.4 13.1 12.6 12.8  HCT 40.1 40.9 38.7 37.7 39.0  MCV 93.5 96.0 94.6 93.5 96.8  PLT 240 267 314 405* 411*   Cardiac Enzymes: No results for input(s): CKTOTAL, CKMB, CKMBINDEX, TROPONINI in the last 168 hours. BNP (last 3 results) No results for input(s): PROBNP  in the last 8760 hours. CBG: No results for input(s): GLUCAP in the last 168 hours. D-Dimer: Recent Labs    05/13/19 0255 05/14/19 0251  DDIMER 0.69* 0.93*   Hgb A1c: No results for input(s): HGBA1C in the last 72 hours. Lipid Profile: No results for input(s): CHOL, HDL, LDLCALC, TRIG, CHOLHDL, LDLDIRECT in the last 72 hours. Thyroid function studies: No results for input(s): TSH, T4TOTAL, T3FREE, THYROIDAB in the last 72 hours.  Invalid input(s): FREET3 Anemia work up: No results for input(s): VITAMINB12, FOLATE, FERRITIN, TIBC, IRON, RETICCTPCT in the last 72 hours. Sepsis Labs: Recent Labs  Lab 05/08/19 0911 05/08/19 0911 05/08/19 1016 05/08/19 1710 05/09/19 0551 05/10/19 0220 05/11/19 0255 05/12/19 0145 05/14/19 0251  PROCALCITON  --   --  <0.10 <0.10  --   --   --   --  0.11  WBC 7.0   < >  --  7.0   < > 4.2 8.1 8.9 8.8  LATICACIDVEN 1.6  --   --   --   --   --   --   --   --    < > = values in this interval not displayed.   Microbiology Recent Results (from the past 240 hour(s))  Blood Culture (routine x 2)     Status: None   Collection Time: 05/08/19  9:11 AM   Specimen: BLOOD  Result Value Ref Range Status   Specimen Description BLOOD BLOOD LEFT HAND  Final   Special Requests   Final    BOTTLES DRAWN AEROBIC AND ANAEROBIC Blood Culture adequate volume   Culture   Final    NO GROWTH 5 DAYS Performed at [facility], [address], [place], [place] [address]    Report Status 05/13/2019 FINAL  Final  Blood Culture (routine x 2)     Status: None   Collection Time: 05/08/19 10:16 AM   Specimen: BLOOD  Result Value Ref Range Status   Specimen Description BLOOD  BLOOD LEFT HAND  Final   Special Requests   Final    BOTTLES DRAWN AEROBIC AND ANAEROBIC Blood Culture adequate volume   Culture   Final    NO GROWTH 5 DAYS Performed at [facility], [address], [place], [place] [address]    Report Status 05/13/2019 FINAL  Final     Medications:   . allopurinol  300 mg Oral Daily  . vitamin C  500 mg Oral Daily  . calcium-vitamin D  1 tablet Oral Daily  . carvedilol  3.125 mg Oral BID  . enoxaparin (LOVENOX) injection  0.5 mg/kg Subcutaneous Daily  . loratadine  10 mg Oral Daily  . losartan  100 mg Oral Daily  . methylPREDNISolone (SOLU-MEDROL) injection  40 mg Intravenous Q12H  . pantoprazole  40 mg Oral BID  . sodium chloride flush  3 mL Intravenous Q12H  . zinc sulfate  220 mg Oral Daily   Continuous Infusions:     LOS: 6 days   Charlynne Cousins  [facility]  05/15/2019, 7:19 AM

## 2019-05-15 NOTE — Progress Notes (Signed)
RN gave pt a shower. Pt was able to tolerate shower well. Pt was on continuous O2. Pt states feeling better and improving.

## 2019-05-16 LAB — BASIC METABOLIC PANEL
Anion gap: 12 (ref 5–15)
BUN: 21 mg/dL (ref 8–23)
CO2: 28 mmol/L (ref 22–32)
Calcium: 8.3 mg/dL — ABNORMAL LOW (ref 8.9–10.3)
Chloride: 94 mmol/L — ABNORMAL LOW (ref 98–111)
Creatinine, Ser: 0.74 mg/dL (ref 0.44–1.00)
GFR calc Af Amer: >60 mL/min (ref >60)
GFR calc non Af Amer: >60 mL/min (ref >60)
Glucose, Bld: 128 mg/dL — ABNORMAL HIGH (ref 70–99)
Potassium: 4.6 mmol/L (ref 3.5–5.1)
Sodium: 134 mmol/L — ABNORMAL LOW (ref 135–145)

## 2019-05-16 LAB — PROCALCITONIN: Procalcitonin: <0.1 ng/mL

## 2019-05-16 MED ORDER — SODIUM CHLORIDE 0.9 % IV SOLN
2.0000 g | INTRAVENOUS | Status: DC
  Administered 2019-05-16 – 2019-05-18 (×3): 2 g via INTRAVENOUS
  Filled 2019-05-16 (×3): qty 20

## 2019-05-16 MED ORDER — SODIUM CHLORIDE 0.9 % IV SOLN
500.0000 mg | INTRAVENOUS | Status: DC
  Administered 2019-05-16 – 2019-05-18 (×3): 500 mg via INTRAVENOUS
  Filled 2019-05-16 (×3): qty 500

## 2019-05-16 NOTE — Progress Notes (Signed)
Physical Therapy Treatment Patient Details Name: Angelica Barnett MRN: [id number] DOB: Mar 04, 1951 Today's Date: 05/16/2019    History of Present Illness 69 y.o. female with a history of obesity, HTN, and gout who presented to [facility] ED 1/24 with worsening shortness of breath for 2 days in the setting of having fatigue, cough, and fevers, chills, dyspnea, nausea since 1/17 when she was diagnosed with covid-19.     PT Comments    Pt making steady progress with mobility. SpO2 85% on 6L O2 with ambulation.    Follow Up Recommendations  No PT follow up     Equipment Recommendations  None recommended by PT    Recommendations for Other Services       Precautions / Restrictions Precautions Precautions: Fall Restrictions Weight Bearing Restrictions: No    Mobility  Bed Mobility               General bed mobility comments: Pt up in chair  Transfers Overall transfer level: Modified independent Equipment used: None Transfers: Sit to/from [facility] Sit to Stand: Modified independent (Device/Increase time) Stand pivot transfers: Modified independent (Device/Increase time)          Ambulation/Gait Ambulation/Gait assistance: Min guard Gait Distance (Feet): 125 Feet Assistive device: None Gait Pattern/deviations: Step-through pattern;Wide base of support;Decreased stride length Gait velocity: decr Gait velocity interpretation: <1.31 ft/sec, indicative of household ambulator General Gait Details: Assist for safety and monitoring of O2 needs.    Stairs             Wheelchair Mobility    Modified Rankin (Stroke Patients Only)       Balance Overall balance assessment: Mild deficits observed, not formally tested                                          Cognition Arousal/Alertness: Awake/alert Behavior During Therapy: WFL for tasks assessed/performed Overall Cognitive Status: Within Functional Limits for tasks assessed                                         Exercises      General Comments General comments (skin integrity, edema, etc.): Pt on 4L of O2 at rest with SpO2 95%. On 6L for amb SpO2 85%. Returned to 90% after 3 minute seated rest break on 6L.       Pertinent Vitals/Pain Pain Assessment: No/denies pain    Home Living                      Prior Function            PT Goals (current goals can now be found in the care plan section) Acute Rehab PT Goals Patient Stated Goal: to go home PT Goal Formulation: With patient Time For Goal Achievement: 05/26/19 Potential to Achieve Goals: Good Progress towards PT goals: Progressing toward goals    Frequency    Min 3X/week      PT Plan Current plan remains appropriate    Co-evaluation              AM-PAC PT "6 Clicks" Mobility   Outcome Measure  Help needed turning from your back to your side while in a flat bed without using bedrails?: None Help needed moving from lying on your  back to sitting on the side of a flat bed without using bedrails?: None Help needed moving to and from a bed to a chair (including a wheelchair)?: None Help needed standing up from a chair using your arms (e.g., wheelchair or bedside chair)?: None Help needed to walk in hospital room?: A Little Help needed climbing 3-5 steps with a railing? : A Lot 6 Click Score: 21    End of Session Equipment Utilized During Treatment: Oxygen Activity Tolerance: Patient limited by fatigue Patient left: with call bell/phone within reach;Other (comment)(on bsc)   PT Visit Diagnosis: Other abnormalities of gait and mobility (R26.89);Muscle weakness (generalized) (M62.81)     Time: [id number] PT Time Calculation (min) (ACUTE ONLY): 13 min  Charges:  $Gait Training: 8-22 mins                     [place] Pager [phone] Office [place] 05/16/2019, 3:05 PM

## 2019-05-16 NOTE — Progress Notes (Signed)
[facility] PROGRESS NOTE    Progress Note  Angelica Barnett  [id number] DOB: Jan 03, 1951 DOA: 05/09/2019 PCP: Angelica Body, MD     Brief Narrative:   Angelica Barnett is an 69 y.o. female past medical history of obesity, essential hypertension gout who presents to [facility] on 05/08/2019 for shortness of breath that started 2 days prior to admission.  Her SARS-CoV-2 PCR came back positive on 04/30/2018 on admission in the ED she was found with a temperature of 103 hypoxic and tachycardic, chest x-ray showed bilateral infiltrates CT angio of the chest was negative for PE but it did come for multifocal pneumonia, her hypoxia progressed she was given Tocilizumab on 05/08/2018 and she was started on IV steroids and remdesivir.  Assessment/Plan:   Acute respiratory failure due to COVID-19/  Pneumonia due to COVID-19 virus/now with severe ARDS due to SARS-CoV-2 infection: He is currently on 6-7 L to keep saturations greater than 90%.  Her inflammatory markers are improved. She has completed her course of IV remdesivir will continue IV steroids for no more than 10 days. She is status post 2 doses of Actemra, continue vitamin C and zinc. Continue strict I's and O's and daily weights, try to keep the patient prone for at least 16 hours a day if not prone out of bed to chair. Continues into spirometry and flutter valve. as her oxygen requirements went up a CT scan of the chest was done that showed bilateral infiltrates, she has a mild leukocytosis with a left shift, procalcitonin is low yield he has remained afebrile we will start her empirically on IV Rocephin and azithromycin.   Abnormal EKG: She continues to denies any chest pain cardiac biomarkers have been negative 2D echo showed no wall motion abnormality.  Gout: Continue current home medications.  Essential hypertension: Pressure stable continue current home regimen.  Morbid obesity: With a BMI greater than 40 she has been  counseled.   DVT prophylaxis: lovenox Family Communication:none Disposition Plan/Barrier to D/C: From home she will probably  go home. Code Status:     Code Status Orders  (From admission, onward)         Start     Ordered   05/09/19 1244  Full code  Continuous     05/09/19 1246        Code Status History    Date Active Date Inactive Code Status Order ID Comments User Context   05/08/2019 1242 05/09/2019 1230 Full Code [id number]  Para Skeans, MD ED   Advance Care Planning Activity    Advance Directive Documentation     Most Recent Value  Type of Advance Directive  Healthcare Power of Attorney  Pre-existing out of facility DNR order (yellow form or pink MOST form)  --  "MOST" Form in Place?  --        IV Access:    Peripheral IV   Procedures and diagnostic studies:   CT CHEST WO CONTRAST  Result Date: 05/15/2019 CLINICAL DATA:  Dyspnea on exertion.  COVID-19 positive. EXAM: CT CHEST WITHOUT CONTRAST TECHNIQUE: Multidetector CT imaging of the chest was performed following the standard protocol without IV contrast. COMPARISON:  05/08/2019 FINDINGS: Cardiovascular: Borderline cardiomegaly unchanged. Thoracic aorta is normal in caliber. Mild stable prominence of the right main pulmonary artery. Remaining vascular structures are unremarkable. Mediastinum/Nodes: No mediastinal or hilar adenopathy. Remaining mediastinal structures are unremarkable. Lungs/Pleura: Lungs are adequately inflated demonstrate diffuse bilateral patchy ground-glass and crazy paving pattern of airspace attenuation compatible  multifocal pneumonia likely viral in origin in this COVID-19 positive patient. There is been interval worsening over the lower lobes. No effusion. Airways are normal. Upper Abdomen: Cholelithiasis.  No acute findings. Musculoskeletal: No acute findings. IMPRESSION: 1. Persistent bilateral patchy airspace process with slight interval worsening over the lower lobes compatible with  multifocal pneumonia likely of viral origin in this known COVID-19 positive patient. 2.  Cholelithiasis. Electronically Signed   By: Angelica Barnett M.D.   On: 05/15/2019 09:34   DG CHEST PORT 1 VIEW  Result Date: 05/14/2019 CLINICAL DATA:  Dyspnea. EXAM: PORTABLE CHEST 1 VIEW COMPARISON:  05/09/2019 FINDINGS: Lungs are adequately inflated demonstrate patchy hazy bilateral airspace opacification over the mid to lower lungs without significant change likely multifocal pneumonia. No evidence of effusion. Cardiomediastinal silhouette and remainder of the exam is unchanged. IMPRESSION: Stable hazy patchy multifocal airspace process over the mid to lower lungs likely multifocal pneumonia. Electronically Signed   By: Angelica Barnett M.D.   On: 05/14/2019 10:22     Medical Consultants:    None.  Anti-Infectives:   IV remdesivir  Subjective:    Angelica Barnett she relates her shortness of breath continues to improve.  Objective:    Vitals:   05/15/19 1700 05/15/19 1740 05/15/19 1927 05/16/19 0331  BP:   133/79 (!) 145/71  Pulse:   85 85  Resp:    19  Temp:   98.2 F (36.8 C) 98.3 F (36.8 C)  TempSrc:   Oral Oral  SpO2: 92% 90% 90% 93%  Weight:      Height:       SpO2: 93 % O2 Flow Rate (L/min): 7 L/min FiO2 (%): 100 %   Intake/Output Summary (Last 24 hours) at 05/16/2019 0643 Last data filed at 05/16/2019 0336 Gross per 24 hour  Intake --  Output 1400 ml  Net -1400 ml   Filed Weights   05/09/19 1756  Weight: 112.6 kg    Exam: General exam: In no acute distress. Respiratory system: Good air movement and clear to auscultation. Cardiovascular system: S1 & S2 heard, RRR. No JVD. Gastrointestinal system: Abdomen is nondistended, soft and nontender.  Extremities: No pedal edema. Skin: No rashes, lesions or ulcers  Data Reviewed:    Labs: Basic Metabolic Panel: Recent Labs  Lab 05/11/19 0255 05/11/19 0255 05/12/19 0145 05/12/19 0145 05/14/19 0251 05/14/19 0251  05/15/19 0356 05/16/19 0341  NA 136  --  135  --  135  --  136 134*  K 3.8   < > 3.8   < > 4.4   < > 4.3 4.6  CL 97*  --  99  --  97*  --  95* 94*  CO2 28  --  26  --  28  --  29 28  GLUCOSE 156*  --  167*  --  145*  --  123* 128*  BUN 26*  --  22  --  22  --  22 21  CREATININE 0.82  --  0.68  --  0.75  --  0.70 0.74  CALCIUM 8.4*  --  8.4*  --  8.2*  --  8.2* 8.3*   < > = values in this interval not displayed.   GFR Estimated Creatinine Clearance: 83.3 mL/min (by C-G formula based on SCr of 0.74 mg/dL). Liver Function Tests: Recent Labs  Lab 05/10/19 0220 05/11/19 0255 05/12/19 0145  AST 32 28 23  ALT 30 29 26   ALKPHOS 63 59 56  BILITOT 0.7 0.9 0.6  PROT 7.2 6.5 6.1*  ALBUMIN 3.4* 3.1* 2.9*   No results for input(s): LIPASE, AMYLASE in the last 168 hours. No results for input(s): AMMONIA in the last 168 hours. Coagulation profile No results for input(s): INR, PROTIME in the last 168 hours. COVID-19 Labs  Recent Labs    05/14/19 0251  DDIMER 0.93*  CRP 0.8    No results found for: SARSCOV2NAA  CBC: Recent Labs  Lab 05/10/19 0220 05/11/19 0255 05/12/19 0145 05/14/19 0251 05/15/19 0356  WBC 4.2 8.1 8.9 8.8 10.9*  NEUTROABS 3.5 7.0 7.7 7.8* 9.7*  HGB 13.4 13.1 12.6 12.8 13.2  HCT 40.9 38.7 37.7 39.0 39.8  MCV 96.0 94.6 93.5 96.8 95.9  PLT 267 314 405* 411* 400   Cardiac Enzymes: No results for input(s): CKTOTAL, CKMB, CKMBINDEX, TROPONINI in the last 168 hours. BNP (last 3 results) No results for input(s): PROBNP in the last 8760 hours. CBG: No results for input(s): GLUCAP in the last 168 hours. D-Dimer: Recent Labs    05/14/19 0251  DDIMER 0.93*   Hgb A1c: No results for input(s): HGBA1C in the last 72 hours. Lipid Profile: No results for input(s): CHOL, HDL, LDLCALC, TRIG, CHOLHDL, LDLDIRECT in the last 72 hours. Thyroid function studies: No results for input(s): TSH, T4TOTAL, T3FREE, THYROIDAB in the last 72 hours.  Invalid input(s):  FREET3 Anemia work up: No results for input(s): VITAMINB12, FOLATE, FERRITIN, TIBC, IRON, RETICCTPCT in the last 72 hours. Sepsis Labs: Recent Labs  Lab 05/11/19 0255 05/12/19 0145 05/14/19 0251 05/15/19 0356  PROCALCITON  --   --  0.11 <0.10  WBC 8.1 8.9 8.8 10.9*   Microbiology Recent Results (from the past 240 hour(s))  Blood Culture (routine x 2)     Status: None   Collection Time: 05/08/19  9:11 AM   Specimen: BLOOD  Result Value Ref Range Status   Specimen Description BLOOD BLOOD LEFT HAND  Final   Special Requests   Final    BOTTLES DRAWN AEROBIC AND ANAEROBIC Blood Culture adequate volume   Culture   Final    NO GROWTH 5 DAYS Performed at [facility], [address] [place], [place] [address]    Report Status 05/13/2019 FINAL  Final  Blood Culture (routine x 2)     Status: None   Collection Time: 05/08/19 10:16 AM   Specimen: BLOOD  Result Value Ref Range Status   Specimen Description BLOOD BLOOD LEFT HAND  Final   Special Requests   Final    BOTTLES DRAWN AEROBIC AND ANAEROBIC Blood Culture adequate volume   Culture   Final    NO GROWTH 5 DAYS Performed at [facility], [address], [place], [place] [address]    Report Status 05/13/2019 FINAL  Final     Medications:   . allopurinol  300 mg Oral Daily  . vitamin C  500 mg Oral Daily  . calcium-vitamin D  1 tablet Oral Daily  . carvedilol  3.125 mg Oral BID  . enoxaparin (LOVENOX) injection  0.5 mg/kg Subcutaneous Daily  . loratadine  10 mg Oral Daily  . losartan  100 mg Oral Daily  . methylPREDNISolone (SOLU-MEDROL) injection  40 mg Intravenous Q12H  . pantoprazole  40 mg Oral BID  . sodium chloride flush  3 mL Intravenous Q12H  . zinc sulfate  220 mg Oral Daily   Continuous Infusions:     LOS: 7 days   Charlynne Cousins  [facility]  05/16/2019, 6:43 AM

## 2019-05-16 NOTE — Progress Notes (Signed)
RN spoke with pt's daughter on the phone. No further concerns.

## 2019-05-17 LAB — CBC WITH DIFFERENTIAL/PLATELET
Abs Immature Granulocytes: 0.22 10*3/uL — ABNORMAL HIGH (ref 0.00–0.07)
Basophils Absolute: 0 10*3/uL (ref 0.0–0.1)
Basophils Relative: 0 %
Eosinophils Absolute: 0 10*3/uL (ref 0.0–0.5)
Eosinophils Relative: 0 %
HCT: 42.5 % (ref 36.0–46.0)
Hemoglobin: 14.1 g/dL (ref 12.0–15.0)
Immature Granulocytes: 2 %
Lymphocytes Relative: 5 %
Lymphs Abs: 0.6 10*3/uL — ABNORMAL LOW (ref 0.7–4.0)
MCH: 31.5 pg (ref 26.0–34.0)
MCHC: 33.2 g/dL (ref 30.0–36.0)
MCV: 95.1 fL (ref 80.0–100.0)
Monocytes Absolute: 0.6 10*3/uL (ref 0.1–1.0)
Monocytes Relative: 4 %
Neutro Abs: 11.3 10*3/uL — ABNORMAL HIGH (ref 1.7–7.7)
Neutrophils Relative %: 89 %
Platelets: 398 10*3/uL (ref 150–400)
RBC: 4.47 MIL/uL (ref 3.87–5.11)
RDW: 12.6 % (ref 11.5–15.5)
WBC: 12.8 10*3/uL — ABNORMAL HIGH (ref 4.0–10.5)
nRBC: 0 % (ref 0.0–0.2)

## 2019-05-17 NOTE — Progress Notes (Signed)
Ambulation Note  Saturation Pre: 96% on 6L  Ambulation Distance: 200 ft  Saturation During Ambulation: 84-92% on 6L  Notes: Pt walked independently. Tolerated well. Pt returned to recliner with call bell within reach, SpO2 93% on 6L.   Philis Kendall, MS, ACSM CEP 9:47 AM 05/17/2019

## 2019-05-17 NOTE — Progress Notes (Signed)
[facility] PROGRESS NOTE    Progress Note  Angelica Barnett  [id number] DOB: [birth year] DOA: 05/09/2019 PCP: Dion Body, MD     Brief Narrative:   Angelica Barnett is an 69 y.o. female past medical history of obesity, essential hypertension gout who presents to [facility] on 05/08/2019 for shortness of breath that started 2 days prior to admission.  Her SARS-CoV-2 PCR came back positive on 04/30/2018 on admission in the ED she was found with a temperature of 103 hypoxic and tachycardic, chest x-ray showed bilateral infiltrates CT angio of the chest was negative for PE but it did come for multifocal pneumonia, her hypoxia progressed she was given Tocilizumab on 05/08/2018 and she was started on IV steroids and remdesivir.  Assessment/Plan:   Acute respiratory failure due to COVID-19/  Pneumonia due to COVID-19 virus/now with severe ARDS due to SARS-CoV-2 infection: Mrs. Angelica Barnett is now requiring 5 to 4 L of oxygen to keep saturations greater than 90% amatory markers have improved. He has completed her course of IV remdesivir and steroids, will continue steroids for no more than 10 days, she is status post 2 doses of Actemra, continue vitamin C and zinc. As her saturations was worsening, CT scan of the chest was done that showed worsening bilateral infiltrates she had a mild leukocytosis with a left shift and mildly elevated procalcitonin she was started empirically on IV Rocephin and azithromycin her oxygen saturations is improved. Try to keep the patient prone for at least 16 hours a day, if not prone out of bed to chair, continue incentive spirometry and flutter valve. Follow strict I's and O's and daily weights try to limit her fluid.  Abnormal EKG: She continues to denies chest pain her cardiac biomarkers have been negative a 2D echo showed no wall motion abnormalities.  Gout: Continue current home medications.  Essential hypertension: Her blood pressure is mildly elevated today  we will continue current regimen, her NSAIDs were stopped as she is currently on steroids which can lead to multiple complications she was started empirically on a PPI.  Morbid obesity: With a BMI greater than 40 she has been counseled.   DVT prophylaxis: lovenox Family Communication:none Disposition Plan/Barrier to D/C: She will probably go home in 2 to 3 days if we can get her off the oxygen Code Status:     Code Status Orders  (From admission, onward)         Start     Ordered   05/09/19 1244  Full code  Continuous     05/09/19 1246        Code Status History    Date Active Date Inactive Code Status Order ID Comments User Context   05/08/2019 1242 05/09/2019 1230 Full Code [id number]  Para Skeans, MD ED   Advance Care Planning Activity    Advance Directive Documentation     Most Recent Value  Type of Advance Directive  Healthcare Power of Attorney  Pre-existing out of facility DNR order (yellow form or pink MOST form)  --  "MOST" Form in Place?  --        IV Access:    Peripheral IV   Procedures and diagnostic studies:   CT CHEST WO CONTRAST  Result Date: 05/15/2019 CLINICAL DATA:  Dyspnea on exertion.  COVID-19 positive. EXAM: CT CHEST WITHOUT CONTRAST TECHNIQUE: Multidetector CT imaging of the chest was performed following the standard protocol without IV contrast. COMPARISON:  05/08/2019 FINDINGS: Cardiovascular: Borderline cardiomegaly unchanged. Thoracic aorta  is normal in caliber. Mild stable prominence of the right main pulmonary artery. Remaining vascular structures are unremarkable. Mediastinum/Nodes: No mediastinal or hilar adenopathy. Remaining mediastinal structures are unremarkable. Lungs/Pleura: Lungs are adequately inflated demonstrate diffuse bilateral patchy ground-glass and crazy paving pattern of airspace attenuation compatible multifocal pneumonia likely viral in origin in this COVID-19 positive patient. There is been interval worsening over the  lower lobes. No effusion. Airways are normal. Upper Abdomen: Cholelithiasis.  No acute findings. Musculoskeletal: No acute findings. IMPRESSION: 1. Persistent bilateral patchy airspace process with slight interval worsening over the lower lobes compatible with multifocal pneumonia likely of viral origin in this known COVID-19 positive patient. 2.  Cholelithiasis. Electronically Signed   By: Marin Olp M.D.   On: 05/15/2019 09:34     Medical Consultants:    None.  Anti-Infectives:   IV remdesivir  Subjective:    Angelica Barnett relates she feels better today she feels like her breathing is improving.  Objective:    Vitals:   05/16/19 1939 05/17/19 0325 05/17/19 0330 05/17/19 0335  BP: 119/73     Pulse:  73 91   Resp:    19  Temp: 97.8 F (36.6 C)   98.7 F (37.1 C)  TempSrc: Oral   Oral  SpO2:  97% 93% (!) 88%  Weight:      Height:       SpO2: (!) 88 % O2 Flow Rate (L/min): 4 L/min FiO2 (%): 91 %   Intake/Output Summary (Last 24 hours) at 05/17/2019 0717 Last data filed at 05/17/2019 0700 Gross per 24 hour  Intake 600 ml  Output 3000 ml  Net -2400 ml   Filed Weights   05/09/19 1756  Weight: 112.6 kg    Exam: General exam: In no acute distress. Respiratory system: Good air movement and clear to auscultation. Cardiovascular system: S1 & S2 heard, RRR. No JVD. Gastrointestinal system: Abdomen is nondistended, soft and nontender.  Extremities: No pedal edema. Skin: No rashes, lesions or ulcers  Data Reviewed:    Labs: Basic Metabolic Panel: Recent Labs  Lab 05/11/19 0255 05/11/19 0255 05/12/19 0145 05/12/19 0145 05/14/19 0251 05/14/19 0251 05/15/19 0356 05/16/19 0341  NA 136  --  135  --  135  --  136 134*  K 3.8   < > 3.8   < > 4.4   < > 4.3 4.6  CL 97*  --  99  --  97*  --  95* 94*  CO2 28  --  26  --  28  --  29 28  GLUCOSE 156*  --  167*  --  145*  --  123* 128*  BUN 26*  --  22  --  22  --  22 21  CREATININE 0.82  --  0.68  --  0.75  --   0.70 0.74  CALCIUM 8.4*  --  8.4*  --  8.2*  --  8.2* 8.3*   < > = values in this interval not displayed.   GFR Estimated Creatinine Clearance: 83.3 mL/min (by C-G formula based on SCr of 0.74 mg/dL). Liver Function Tests: Recent Labs  Lab 05/11/19 0255 05/12/19 0145  AST 28 23  ALT 29 26  ALKPHOS 59 56  BILITOT 0.9 0.6  PROT 6.5 6.1*  ALBUMIN 3.1* 2.9*   No results for input(s): LIPASE, AMYLASE in the last 168 hours. No results for input(s): AMMONIA in the last 168 hours. Coagulation profile No results for input(s): INR, PROTIME  in the last 168 hours. COVID-19 Labs  No results for input(s): DDIMER, FERRITIN, LDH, CRP in the last 72 hours.  No results found for: SARSCOV2NAA  CBC: Recent Labs  Lab 05/11/19 0255 05/12/19 0145 05/14/19 0251 05/15/19 0356  WBC 8.1 8.9 8.8 10.9*  NEUTROABS 7.0 7.7 7.8* 9.7*  HGB 13.1 12.6 12.8 13.2  HCT 38.7 37.7 39.0 39.8  MCV 94.6 93.5 96.8 95.9  PLT 314 405* 411* 400   Cardiac Enzymes: No results for input(s): CKTOTAL, CKMB, CKMBINDEX, TROPONINI in the last 168 hours. BNP (last 3 results) No results for input(s): PROBNP in the last 8760 hours. CBG: No results for input(s): GLUCAP in the last 168 hours. D-Dimer: No results for input(s): DDIMER in the last 72 hours. Hgb A1c: No results for input(s): HGBA1C in the last 72 hours. Lipid Profile: No results for input(s): CHOL, HDL, LDLCALC, TRIG, CHOLHDL, LDLDIRECT in the last 72 hours. Thyroid function studies: No results for input(s): TSH, T4TOTAL, T3FREE, THYROIDAB in the last 72 hours.  Invalid input(s): FREET3 Anemia work up: No results for input(s): VITAMINB12, FOLATE, FERRITIN, TIBC, IRON, RETICCTPCT in the last 72 hours. Sepsis Labs: Recent Labs  Lab 05/11/19 0255 05/12/19 0145 05/14/19 0251 05/15/19 0356 05/16/19 0341  PROCALCITON  --   --  0.11 <0.10 <0.10  WBC 8.1 8.9 8.8 10.9*  --    Microbiology Recent Results (from the past 240 hour(s))  Blood Culture  (routine x 2)     Status: None   Collection Time: 05/08/19  9:11 AM   Specimen: BLOOD  Result Value Ref Range Status   Specimen Description BLOOD BLOOD LEFT HAND  Final   Special Requests   Final    BOTTLES DRAWN AEROBIC AND ANAEROBIC Blood Culture adequate volume   Culture   Final    NO GROWTH 5 DAYS Performed at [facility], [address] [place], [place] [address]    Report Status 05/13/2019 FINAL  Final  Blood Culture (routine x 2)     Status: None   Collection Time: 05/08/19 10:16 AM   Specimen: BLOOD  Result Value Ref Range Status   Specimen Description BLOOD BLOOD LEFT HAND  Final   Special Requests   Final    BOTTLES DRAWN AEROBIC AND ANAEROBIC Blood Culture adequate volume   Culture   Final    NO GROWTH 5 DAYS Performed at [facility], [address] [place], [place] [address]    Report Status 05/13/2019 FINAL  Final     Medications:    allopurinol  300 mg Oral Daily   vitamin C  500 mg Oral Daily   calcium-vitamin D  1 tablet Oral Daily   carvedilol  3.125 mg Oral BID   enoxaparin (LOVENOX) injection  0.5 mg/kg Subcutaneous Daily   loratadine  10 mg Oral Daily   losartan  100 mg Oral Daily   methylPREDNISolone (SOLU-MEDROL) injection  40 mg Intravenous Q12H   pantoprazole  40 mg Oral BID   sodium chloride flush  3 mL Intravenous Q12H   zinc sulfate  220 mg Oral Daily   Continuous Infusions:  azithromycin 500 mg (05/16/19 0916)   cefTRIAXone (ROCEPHIN)  IV 2 g (05/17/19 0703)      LOS: 8 days   Charlynne Cousins  [facility]  05/17/2019, 7:17 AM

## 2019-05-17 NOTE — Progress Notes (Signed)
Physical Therapy Treatment Patient Details Name: Angelica Barnett MRN: [place]:[id number] DOB: Aug 08, 1950 Today's Date: 05/17/2019    History of Present Illness 69 y.o. female with a history of obesity, HTN, and gout who presented to [facility] ED 1/24 with worsening shortness of breath for 2 days in the setting of having fatigue, cough, and fevers, chills, dyspnea, nausea since 1/17 when she was diagnosed with covid-19.     PT Comments    Pt did well with tx, independence is good pt at SBA/Mod I level, activity tolerance is deceased but with endocuragement able to push herself a little to get to goal. Pt ambulated approx 243ft with stand by assist, with 1 standing rest break, pt was on 3L/min via [place] and desat to min of 83% with standing rets break and pursed lip breathing increaed to 87% aftre completion of ambulation seated in recliner with BLE elevated quickly recovered to 88% within 1 min. Therapist reinforced use of IS and FL and also educated pt on sit<>stand exercise.     Follow Up Recommendations  No PT follow up     Equipment Recommendations  None recommended by PT    Recommendations for Other Services       Precautions / Restrictions Precautions Precautions: Fall Restrictions Weight Bearing Restrictions: No    Mobility  Bed Mobility               General bed mobility comments: pt found in recliner at therapist arrival  Transfers Overall transfer level: Modified independent Equipment used: None Transfers: Sit to/from [facility] Sit to Stand: Modified independent (Device/Increase time)            Ambulation/Gait Ambulation/Gait assistance: Supervision Gait Distance (Feet): 250 Feet Assistive device: None Gait Pattern/deviations: Step-through pattern;Wide base of support;Decreased stride length Gait velocity: decr   General Gait Details: ambulated approx 234ft with SBA and 1 standing rest break, on 3L/min via [place] and desat to min of 83%   Stairs              Wheelchair Mobility    Modified Rankin (Stroke Patients Only)       Balance Overall balance assessment: Mild deficits observed, not formally tested                                          Cognition Arousal/Alertness: Awake/alert Behavior During Therapy: WFL for tasks assessed/performed Overall Cognitive Status: Within Functional Limits for tasks assessed                                        Exercises      General Comments        Pertinent Vitals/Pain Pain Assessment: No/denies pain    Home Living                      Prior Function            PT Goals (current goals can now be found in the care plan section) Acute Rehab PT Goals Patient Stated Goal: to get 02 sats up to be able to go home PT Goal Formulation: With patient Time For Goal Achievement: 05/26/19 Potential to Achieve Goals: Good Progress towards PT goals: Progressing toward goals    Frequency    Min 3X/week  PT Plan Current plan remains appropriate    Co-evaluation              AM-PAC PT "6 Clicks" Mobility   Outcome Measure  Help needed turning from your back to your side while in a flat bed without using bedrails?: None Help needed moving from lying on your back to sitting on the side of a flat bed without using bedrails?: None Help needed moving to and from a bed to a chair (including a wheelchair)?: None Help needed standing up from a chair using your arms (e.g., wheelchair or bedside chair)?: None Help needed to walk in hospital room?: A Little Help needed climbing 3-5 steps with a railing? : A Lot 6 Click Score: 21    End of Session Equipment Utilized During Treatment: Oxygen Activity Tolerance: Patient limited by fatigue Patient left: in chair;with call bell/phone within reach Nurse Communication: Mobility status PT Visit Diagnosis: Other abnormalities of gait and mobility (R26.89);Muscle weakness  (generalized) (M62.81)     Time: [id number] PT Time Calculation (min) (ACUTE ONLY): 23 min  Charges:  $Gait Training: 23-37 mins                     Horald Chestnut, [place] 05/17/2019, 4:02 PM

## 2019-05-18 DIAGNOSIS — M1000 Idiopathic gout, unspecified site: Secondary | ICD-10-CM

## 2019-05-18 LAB — CBC WITH DIFFERENTIAL/PLATELET
Abs Immature Granulocytes: 0.19 10*3/uL — ABNORMAL HIGH (ref 0.00–0.07)
Basophils Absolute: 0 10*3/uL (ref 0.0–0.1)
Basophils Relative: 0 %
Eosinophils Absolute: 0 10*3/uL (ref 0.0–0.5)
Eosinophils Relative: 0 %
HCT: 39.3 % (ref 36.0–46.0)
Hemoglobin: 13.1 g/dL (ref 12.0–15.0)
Immature Granulocytes: 2 %
Lymphocytes Relative: 5 %
Lymphs Abs: 0.6 10*3/uL — ABNORMAL LOW (ref 0.7–4.0)
MCH: 31.8 pg (ref 26.0–34.0)
MCHC: 33.3 g/dL (ref 30.0–36.0)
MCV: 95.4 fL (ref 80.0–100.0)
Monocytes Absolute: 0.6 10*3/uL (ref 0.1–1.0)
Monocytes Relative: 5 %
Neutro Abs: 9.5 10*3/uL — ABNORMAL HIGH (ref 1.7–7.7)
Neutrophils Relative %: 88 %
Platelets: 357 10*3/uL (ref 150–400)
RBC: 4.12 MIL/uL (ref 3.87–5.11)
RDW: 12.7 % (ref 11.5–15.5)
WBC: 10.9 10*3/uL — ABNORMAL HIGH (ref 4.0–10.5)
nRBC: 0 % (ref 0.0–0.2)

## 2019-05-18 LAB — PHOSPHORUS: Phosphorus: 5 mg/dL — ABNORMAL HIGH (ref 2.5–4.6)

## 2019-05-18 LAB — COMPREHENSIVE METABOLIC PANEL
ALT: 26 U/L (ref 0–44)
AST: 18 U/L (ref 15–41)
Albumin: 3 g/dL — ABNORMAL LOW (ref 3.5–5.0)
Alkaline Phosphatase: 47 U/L (ref 38–126)
Anion gap: 10 (ref 5–15)
BUN: 17 mg/dL (ref 8–23)
CO2: 27 mmol/L (ref 22–32)
Calcium: 8.3 mg/dL — ABNORMAL LOW (ref 8.9–10.3)
Chloride: 99 mmol/L (ref 98–111)
Creatinine, Ser: 0.68 mg/dL (ref 0.44–1.00)
GFR calc Af Amer: >60 mL/min (ref >60)
GFR calc non Af Amer: >60 mL/min (ref >60)
Glucose, Bld: 115 mg/dL — ABNORMAL HIGH (ref 70–99)
Potassium: 4.7 mmol/L (ref 3.5–5.1)
Sodium: 136 mmol/L (ref 135–145)
Total Bilirubin: 0.5 mg/dL (ref 0.3–1.2)
Total Protein: 5.7 g/dL — ABNORMAL LOW (ref 6.5–8.1)

## 2019-05-18 LAB — MAGNESIUM: Magnesium: 2.3 mg/dL (ref 1.7–2.4)

## 2019-05-18 LAB — FERRITIN: Ferritin: 318 ng/mL — ABNORMAL HIGH (ref 11–307)

## 2019-05-18 LAB — C-REACTIVE PROTEIN: CRP: <0.5 mg/dL (ref <1.0)

## 2019-05-18 LAB — D-DIMER, QUANTITATIVE: D-Dimer, Quant: 1.74 ug/mL-FEU — ABNORMAL HIGH (ref 0.00–0.50)

## 2019-05-18 LAB — PROCALCITONIN: Procalcitonin: <0.1 ng/mL

## 2019-05-18 MED ORDER — DEXAMETHASONE 6 MG PO TABS
6.0000 mg | ORAL_TABLET | Freq: Every day | ORAL | Status: DC
  Administered 2019-05-18 – 2019-05-19 (×2): 6 mg via ORAL
  Filled 2019-05-18 (×2): qty 1

## 2019-05-18 MED ORDER — CEFIXIME 400 MG PO CAPS: 400.0000 mg | ORAL_CAPSULE | Freq: Every day | ORAL | Status: DC

## 2019-05-18 MED ORDER — CEFDINIR 300 MG PO CAPS
600.0000 mg | ORAL_CAPSULE | Freq: Every day | ORAL | Status: DC
  Administered 2019-05-19: 600 mg via ORAL
  Filled 2019-05-18 (×2): qty 2

## 2019-05-18 MED ORDER — IPRATROPIUM-ALBUTEROL 20-100 MCG/ACT IN AERS
1.0000 | INHALATION_SPRAY | Freq: Four times a day (QID) | RESPIRATORY_TRACT | Status: DC
  Administered 2019-05-19 (×2): 1 via RESPIRATORY_TRACT
  Filled 2019-05-18: qty 4

## 2019-05-18 MED ORDER — AZITHROMYCIN 500 MG PO TABS
500.0000 mg | ORAL_TABLET | Freq: Every day | ORAL | Status: DC
  Administered 2019-05-19: 500 mg via ORAL
  Filled 2019-05-18: qty 1

## 2019-05-18 NOTE — Progress Notes (Signed)
PROGRESS NOTE    Angelica Barnett  [id number] DOB: 1950/11/24 DOA: 05/09/2019 PCP: Dion Body, MD   Brief Narrative:  Angelica Barnett is an 69 y.o. WF PMHx Obesity, essential HTN Gout   Presents to [facility] on 05/08/2019 for shortness of breath that started 2 days prior to admission.  Her SARS-CoV-2 PCR came back positive on 04/30/2018 on admission in the ED she was found with a temperature of 103 hypoxic and tachycardic, chest x-ray showed bilateral infiltrates CT angio of the chest was negative for PE but it did come for multifocal pneumonia, her hypoxia progressed she was given Tocilizumab on 05/08/2018 and she was started on IV steroids and remdesivir.   Subjective: A/O x4  positive S OB.  States not on home O2   Assessment & Plan:   Principal Problem:   Acute respiratory failure due to severe acute respiratory syndrome coronavirus 2 (SARS-CoV-2) infection (HCC) Active Problems:   Acute respiratory failure with hypoxia (HCC)   Hypertension   GERD (gastroesophageal reflux disease)   Gout   Anemia   Pneumonia due to COVID-19 virus   Abnormal EKG   Covid pneumonia/acute respiratory failure with hypoxia/severe ARDS COVID-19 Labs  Recent Labs    05/18/19 0819  DDIMER 1.74*  FERRITIN 318*  CRP <0.5    SARS-CoV-2 PCR came back positive on 04/30/2018   -Remdesivir course completed -Decadron p.o. 6 mg daily -Actemra x2 doses complete -Vitamins per Covid protocol -Combivent -Flutter valve -Incentive spirometry -Titrate O2 to maintain SPO2> 88% SATURATION QUALIFICATIONS: (This note is used to comply with regulatory documentation for home oxygen) Patient Saturations on Room Air at Rest = 91% Patient Saturations on [employer] while Ambulating = 74 Patient Saturations on 4 Liters of oxygen while Ambulating = 87% Please briefly explain why patient needs home oxygen: -Patient meets criteria for home O2 -4 L O2 via [place] titrate to maintain SPO2> 8% -Provide [employer] portable  home O2 concentrator   CAP -Complete 5-day course antibiotics -Monitor procalcitonin  Abnormal EKG -Negative chest pain -1/25 normal echocardiogram Results for Angelica, Barnett (MRN [place]:[id number]) as of 05/18/2019 14:32  Ref. Range 05/08/2019 10:16 05/08/2019 17:10 05/08/2019 19:37 05/08/2019 21:22  Troponin I (High Sensitivity) Latest Ref Range: <18 ng/L 7 6 5 4     Essential HTN -Coreg 3.125 mg BID -Losartan 100 mg daily  Morbid obesity (BMI 40.0) -Patient counseled   Gout -Allopurinol 300 mg daily     DVT prophylaxis: Lovenox Code Status: Full Family Communication:  Disposition Plan: TBD   Consultants:    Procedures/Significant Events:  1/25 echocardiogram; normal   I have personally reviewed and interpreted all radiology studies and my findings are as above.  VENTILATOR SETTINGS: Nasal cannula 2/3 Flow; 3 L/min SPO2 88%   Cultures SARS-CoV-2 PCR came back positive on 04/30/2018      Antimicrobials: Anti-infectives (From admission, onward)   Start     Dose/Rate Stop   05/19/19 1000  azithromycin (ZITHROMAX) tablet 500 mg     500 mg 05/21/19 0959   05/19/19 0800  cefdinir (OMNICEF) capsule 600 mg     600 mg 05/22/19 0759   05/18/19 1445  cefixime (SUPRAX) capsule 400 mg  Status:  Discontinued     400 mg 05/18/19 1455   05/16/19 0700  cefTRIAXone (ROCEPHIN) 2 g in sodium chloride 0.9 % 100 mL IVPB  Status:  Discontinued     2 g 200 mL/hr over 30 Minutes 05/18/19 1439   05/16/19 0700  azithromycin (ZITHROMAX)  500 mg in sodium chloride 0.9 % 250 mL IVPB  Status:  Discontinued     500 mg 250 mL/hr over 60 Minutes 05/18/19 1011   05/10/19 1000  remdesivir 100 mg in sodium chloride 0.9 % 100 mL IVPB     100 mg 200 mL/hr over 30 Minutes 05/12/19 0858      Devices    LINES / TUBES:      Continuous Infusions: . azithromycin 500 mg (05/18/19 0640)  . cefTRIAXone (ROCEPHIN)  IV 2 g (05/18/19 [id number])     Objective: Vitals:   05/17/19 1145 05/17/19  1950 05/18/19 0520 05/18/19 0525  BP: 122/63     Pulse: 74     Resp: 18 18  20   Temp: 97.8 F (36.6 C) 98.8 F (37.1 C) 98.6 F (37 C) 98.6 F (37 C)  TempSrc: Oral  Oral Oral  SpO2: 93%   94%  Weight:      Height:        Intake/Output Summary (Last 24 hours) at 05/18/2019 0800 Last data filed at 05/18/2019 0530 Gross per 24 hour  Intake 480 ml  Output 1650 ml  Net -1170 ml   Filed Weights   05/09/19 1756  Weight: 112.6 kg    Examination:  General: A/O x4, positive acute respiratory distress Eyes: negative scleral hemorrhage, negative anisocoria, negative icterus ENT: Negative Runny nose, negative gingival bleeding, Neck:  Negative scars, masses, torticollis, lymphadenopathy, JVD Lungs: Clear to auscultation bilaterally without wheezes or crackles Cardiovascular: Regular rate and rhythm without murmur gallop or rub normal S1 and S2 Abdomen: MORBIDLY OBESE, negative abdominal pain, nondistended, positive soft, bowel sounds, no rebound, no ascites, no appreciable mass Extremities: No significant cyanosis, clubbing, or edema bilateral lower extremities Skin: Negative rashes, lesions, ulcers Psychiatric:  Negative depression, negative anxiety, negative fatigue, negative mania  Central nervous system:  Cranial nerves II through XII intact, tongue/uvula midline, all extremities muscle strength 5/5, sensation intact throughout, negative dysarthria, negative expressive aphasia, negative receptive aphasia.  .     Data Reviewed: Care during the described time interval was provided by me .  I have reviewed this patient's available data, including medical history, events of note, physical examination, and all test results as part of my evaluation.   CBC: Recent Labs  Lab 05/12/19 0145 05/14/19 0251 05/15/19 0356 05/17/19 0855  WBC 8.9 8.8 10.9* 12.8*  NEUTROABS 7.7 7.8* 9.7* 11.3*  HGB 12.6 12.8 13.2 14.1  HCT 37.7 39.0 39.8 42.5  MCV 93.5 96.8 95.9 95.1  PLT 405* 411* 400  [id number]   Basic Metabolic Panel: Recent Labs  Lab 05/12/19 0145 05/14/19 0251 05/15/19 0356 05/16/19 0341  NA 135 135 136 134*  K 3.8 4.4 4.3 4.6  CL 99 97* 95* 94*  CO2 26 28 29 28   GLUCOSE 167* 145* 123* 128*  BUN 22 22 22 21   CREATININE 0.68 0.75 0.70 0.74  CALCIUM 8.4* 8.2* 8.2* 8.3*   GFR: Estimated Creatinine Clearance: 83.3 mL/min (by C-G formula based on SCr of 0.74 mg/dL). Liver Function Tests: Recent Labs  Lab 05/12/19 0145  AST 23  ALT 26  ALKPHOS 56  BILITOT 0.6  PROT 6.1*  ALBUMIN 2.9*   No results for input(s): LIPASE, AMYLASE in the last 168 hours. No results for input(s): AMMONIA in the last 168 hours. Coagulation Profile: No results for input(s): INR, PROTIME in the last 168 hours. Cardiac Enzymes: No results for input(s): CKTOTAL, CKMB, CKMBINDEX, TROPONINI in the last 168 hours. BNP (  last 3 results) No results for input(s): PROBNP in the last 8760 hours. HbA1C: No results for input(s): HGBA1C in the last 72 hours. CBG: No results for input(s): GLUCAP in the last 168 hours. Lipid Profile: No results for input(s): CHOL, HDL, LDLCALC, TRIG, CHOLHDL, LDLDIRECT in the last 72 hours. Thyroid Function Tests: No results for input(s): TSH, T4TOTAL, FREET4, T3FREE, THYROIDAB in the last 72 hours. Anemia Panel: No results for input(s): VITAMINB12, FOLATE, FERRITIN, TIBC, IRON, RETICCTPCT in the last 72 hours. Urine analysis: No results found for: COLORURINE, APPEARANCEUR, LABSPEC, PHURINE, GLUCOSEU, HGBUR, BILIRUBINUR, [place], [place], UROBILINOGEN, NITRITE, LEUKOCYTESUR Sepsis Labs: @LABRCNTIP (procalcitonin:4,lacticidven:4)  ) Recent Results (from the past 240 hour(s))  Blood Culture (routine x 2)     Status: None   Collection Time: 05/08/19  9:11 AM   Specimen: BLOOD  Result Value Ref Range Status   Specimen Description BLOOD BLOOD LEFT HAND  Final   Special Requests   Final    BOTTLES DRAWN AEROBIC AND ANAEROBIC Blood Culture adequate volume     Culture   Final    NO GROWTH 5 DAYS Performed at [facility], [address], [place], [place] [address]    Report Status 05/13/2019 FINAL  Final  Blood Culture (routine x 2)     Status: None   Collection Time: 05/08/19 10:16 AM   Specimen: BLOOD  Result Value Ref Range Status   Specimen Description BLOOD BLOOD LEFT HAND  Final   Special Requests   Final    BOTTLES DRAWN AEROBIC AND ANAEROBIC Blood Culture adequate volume   Culture   Final    NO GROWTH 5 DAYS Performed at [facility], [address], [place], [place] [address]    Report Status 05/13/2019 FINAL  Final         Radiology Studies: No results found.      Scheduled Meds: . allopurinol  300 mg Oral Daily  . vitamin C  500 mg Oral Daily  . calcium-vitamin D  1 tablet Oral Daily  . carvedilol  3.125 mg Oral BID  . enoxaparin (LOVENOX) injection  0.5 mg/kg Subcutaneous Daily  . loratadine  10 mg Oral Daily  . losartan  100 mg Oral Daily  . methylPREDNISolone (SOLU-MEDROL) injection  40 mg Intravenous Q12H  . pantoprazole  40 mg Oral BID  . sodium chloride flush  3 mL Intravenous Q12H  . zinc sulfate  220 mg Oral Daily   Continuous Infusions: . azithromycin 500 mg (05/18/19 0640)  . cefTRIAXone (ROCEPHIN)  IV 2 g (05/18/19 0639)     LOS: 9 days   The patient is critically ill with multiple organ systems failure and requires high complexity decision making for assessment and support, frequent evaluation and titration of therapies, application of advanced monitoring technologies and extensive interpretation of multiple databases. Critical Care Time devoted to patient care services described in this note  Time spent: 40 minutes     Betsaida [place], Geraldo Docker, MD [facility] Pager [phone]  If 7PM-7AM, please contact night-coverage [email or web address] Password [email or web address] 05/18/2019, 8:00 AM

## 2019-05-18 NOTE — Plan of Care (Signed)
   Vital Signs MEWS/VS Documentation       05/18/2019 0827 05/18/2019 1515 05/18/2019 1940 05/18/2019 2100   MEWS Score:  0  0  0  0   MEWS Score Color:  Green  Green  Green  Green   Resp:  --  18  18  --   Pulse:  99  83  84  --   BP:  (!) 124/58  120/61  130/67  --   Temp:  98.8 F (37.1 C)  --  98 F (36.7 C)  --   O2 Device:  Nasal Cannula  Nasal Cannula  Nasal Cannula  --   O2 Flow Rate (L/min):  3 L/min  2 L/min  3 L/min  --   Level of Consciousness:  --  --  --  Alert        POC reviewed with pt. Self-ambulated and used bed side commode.    Angelica Barnett 05/18/2019,9:24 PM

## 2019-05-18 NOTE — TOC Initial Note (Signed)
Transition of Care [facility]) - Initial/Assessment Note    Patient Details  Name: Angelica Barnett MRN: [id number] Date of Birth: 1950-06-20  Transition of Care [facility]) CM/SW Contact:    Shade Flood, LCSW Phone Number: 05/18/2019, 3:10 PM  Clinical Narrative:                  Pt from home. Per MD, he is anticipating pt will be stable for dc tomorrow with Home O2. Referral sent to [employer] and they will set up home concentrator tomorrow before dc. There are no other TOC needs identified for dc at this time.  Assigned TOC will follow up on 2/4.  Expected Discharge Plan: Home/Self Care Barriers to Discharge: Continued Medical Work up   Patient Goals and CMS Choice        Expected Discharge Plan and Services Expected Discharge Plan: Home/Self Care In-house Referral: Clinical Social Work   Post Acute Care Choice: Durable Medical Equipment Living arrangements for the past 2 months: Single Family Home                 DME Arranged: Oxygen DME Agency: [employer] Date DME Agency Contacted: 05/18/19 Time DME Agency Contacted: 22 Representative spoke with at DME Agency: Thedore Mins            Prior Living Arrangements/Services Living arrangements for the past 2 months: [place] Lives with:: Spouse Patient language and need for interpreter reviewed:: Yes Do you feel safe going back to the place where you live?: Yes      Need for Family Participation in Patient Care: No (Comment) Care giver support system in place?: Yes (comment)   Criminal Activity/Legal Involvement Pertinent to Current Situation/Hospitalization: No - Comment as needed  Activities of Daily Living Home Assistive Devices/Equipment: None ADL Screening (condition at time of admission) Patient's cognitive ability adequate to safely complete daily activities?: Yes Is the patient deaf or have difficulty hearing?: No Does the patient have difficulty seeing, even when wearing glasses/contacts?: No Does the patient  have difficulty concentrating, remembering, or making decisions?: No Patient able to express need for assistance with ADLs?: Yes Does the patient have difficulty dressing or bathing?: No Independently performs ADLs?: No Communication: Independent Dressing (OT): Needs assistance Is this a change from baseline?: Change from baseline, expected to last <3days Grooming: Needs assistance Is this a change from baseline?: Change from baseline, expected to last <3 days Feeding: Independent Bathing: Needs assistance Is this a change from baseline?: Change from baseline, expected to last <3 days Toileting: Needs assistance Is this a change from baseline?: Change from baseline, expected to last <3 days In/Out Bed: Needs assistance Is this a change from baseline?: Change from baseline, expected to last <3 days Walks in Home: Independent Does the patient have difficulty walking or climbing stairs?: No Weakness of Legs: None Weakness of Arms/Hands: None  Permission Sought/Granted                  Emotional Assessment       Orientation: : Oriented to Self, Oriented to Place, Oriented to  Time, Oriented to Situation Alcohol / Substance Use: Not Applicable Psych Involvement: No (comment)  Admission diagnosis:  Acute respiratory failure due to severe acute respiratory syndrome coronavirus 2 (SARS-CoV-2) infection ([place]) [U07.1, J96.00] Patient Active Problem List   Diagnosis Date Noted  . Acute respiratory failure due to severe acute respiratory syndrome coronavirus 2 (SARS-CoV-2) infection ([place]) 05/09/2019  . Acute respiratory failure with hypoxia ([place]) 05/08/2019  .  Abnormal EKG 05/08/2019  . Hypertension   . GERD (gastroesophageal reflux disease)   . Gout   . Anemia   . Pneumonia due to COVID-19 virus    PCP:  Dion Body, MD Pharmacy:   [place], [place] [address] Phone: [phone] Fax: [phone]     Social  Determinants of Health (SDOH) Interventions    Readmission Risk Interventions No flowsheet data found.

## 2019-05-18 NOTE — Progress Notes (Signed)
Physical Therapy Treatment Patient Details Name: Angelica Barnett MRN: [id number] DOB: [birth year] Today's Date: 05/18/2019    History of Present Illness 69 y.o. female with a history of obesity, HTN, and gout who presented to [facility] ED 1/24 with worsening shortness of breath for 2 days in the setting of having fatigue, cough, and fevers, chills, dyspnea, nausea since 1/17 when she was diagnosed with covid-19.     PT Comments    Pt continues to make progress with mobility, independence and activity tolerance. Pt was able to ambulate approx 239ft with stand by assist, pt did not need any rest breaks to complete distance this am. She was on 3L/min via [place] at rest and sats in 90s but with ambulation needed increase to 4L/min via [place], noted pt desat to min of 85% during ambulation, at end of distance once sitting and pursed lip breathing took up to 46min to recover to 90s.      Follow Up Recommendations  No PT follow up     Equipment Recommendations  None recommended by PT    Recommendations for Other Services OT consult     Precautions / Restrictions Precautions Precautions: Fall Restrictions Weight Bearing Restrictions: No    Mobility  Bed Mobility               General bed mobility comments: Pt sitting in recliner at therapist arrival  Transfers Overall transfer level: Modified independent                  Ambulation/Gait Ambulation/Gait assistance: Supervision Gait Distance (Feet): 250 Feet Assistive device: None Gait Pattern/deviations: Step-through pattern Gait velocity: decreased   General Gait Details: 239ft with no standing rest breaks today, on 4L/min via [place] with ambulation desat to min 85%   Stairs             Wheelchair Mobility    Modified Rankin (Stroke Patients Only)       Balance Overall balance assessment: Mild deficits observed, not formally tested                                          Cognition  Arousal/Alertness: Awake/alert Behavior During Therapy: WFL for tasks assessed/performed Overall Cognitive Status: Within Functional Limits for tasks assessed                                        Exercises Other Exercises Other Exercises: incentive spirometer x 10 Other Exercises: flutter valve x 10    General Comments        Pertinent Vitals/Pain Pain Assessment: No/denies pain    Home Living                      Prior Function            PT Goals (current goals can now be found in the care plan section) Acute Rehab PT Goals Patient Stated Goal: to go home PT Goal Formulation: With patient Time For Goal Achievement: 05/26/19 Potential to Achieve Goals: Good Progress towards PT goals: Progressing toward goals    Frequency    Min 3X/week      PT Plan Current plan remains appropriate    Co-evaluation              AM-PAC  PT "6 Clicks" Mobility   Outcome Measure  Help needed turning from your back to your side while in a flat bed without using bedrails?: None Help needed moving from lying on your back to sitting on the side of a flat bed without using bedrails?: None Help needed moving to and from a bed to a chair (including a wheelchair)?: None Help needed standing up from a chair using your arms (e.g., wheelchair or bedside chair)?: None Help needed to walk in hospital room?: A Little Help needed climbing 3-5 steps with a railing? : A Little 6 Click Score: 22    End of Session Equipment Utilized During Treatment: Oxygen Activity Tolerance: Patient limited by fatigue Patient left: in chair;with call bell/phone within reach Nurse Communication: Mobility status PT Visit Diagnosis: Other abnormalities of gait and mobility (R26.89);Muscle weakness (generalized) (M62.81)     Time: [id number] PT Time Calculation (min) (ACUTE ONLY): 25 min  Charges:  $Gait Training: 23-37 mins                     Horald Chestnut, PT    Delford Field 05/18/2019, 12:31 PM

## 2019-05-18 NOTE — Progress Notes (Signed)
SATURATION QUALIFICATIONS: (This note is used to comply with regulatory documentation for home oxygen)  Patient Saturations on Room Air at Rest = 91%  Patient Saturations on Room Air while Ambulating = 74  Patient Saturations on 4 Liters of oxygen while Ambulating = 87%  Please briefly explain why patient needs home oxygen:

## 2019-05-18 NOTE — Progress Notes (Signed)
Ok to change cefixime to cefdinir per Dr. Sherral Hammers.  Onnie Boer, PharmD, BCIDP, AAHIVP, CPP Infectious Disease Pharmacist 05/18/2019 2:58 PM

## 2019-05-19 DIAGNOSIS — K2100 Gastro-esophageal reflux disease with esophagitis, without bleeding: Secondary | ICD-10-CM

## 2019-05-19 LAB — CBC WITH DIFFERENTIAL/PLATELET
Abs Immature Granulocytes: 0.16 10*3/uL — ABNORMAL HIGH (ref 0.00–0.07)
Basophils Absolute: 0 10*3/uL (ref 0.0–0.1)
Basophils Relative: 0 %
Eosinophils Absolute: 0 10*3/uL (ref 0.0–0.5)
Eosinophils Relative: 0 %
HCT: 38 % (ref 36.0–46.0)
Hemoglobin: 12.5 g/dL (ref 12.0–15.0)
Immature Granulocytes: 2 %
Lymphocytes Relative: 4 %
Lymphs Abs: 0.4 10*3/uL — ABNORMAL LOW (ref 0.7–4.0)
MCH: 31.3 pg (ref 26.0–34.0)
MCHC: 32.9 g/dL (ref 30.0–36.0)
MCV: 95 fL (ref 80.0–100.0)
Monocytes Absolute: 0.4 10*3/uL (ref 0.1–1.0)
Monocytes Relative: 5 %
Neutro Abs: 7.1 10*3/uL (ref 1.7–7.7)
Neutrophils Relative %: 89 %
Platelets: 353 10*3/uL (ref 150–400)
RBC: 4 MIL/uL (ref 3.87–5.11)
RDW: 12.7 % (ref 11.5–15.5)
WBC: 8 10*3/uL (ref 4.0–10.5)
nRBC: 0 % (ref 0.0–0.2)

## 2019-05-19 LAB — D-DIMER, QUANTITATIVE: D-Dimer, Quant: 2.23 ug/mL-FEU — ABNORMAL HIGH (ref 0.00–0.50)

## 2019-05-19 LAB — MAGNESIUM: Magnesium: 2.3 mg/dL (ref 1.7–2.4)

## 2019-05-19 LAB — COMPREHENSIVE METABOLIC PANEL
ALT: 28 U/L (ref 0–44)
AST: 18 U/L (ref 15–41)
Albumin: 2.9 g/dL — ABNORMAL LOW (ref 3.5–5.0)
Alkaline Phosphatase: 45 U/L (ref 38–126)
Anion gap: 9 (ref 5–15)
BUN: 20 mg/dL (ref 8–23)
CO2: 27 mmol/L (ref 22–32)
Calcium: 8.3 mg/dL — ABNORMAL LOW (ref 8.9–10.3)
Chloride: 99 mmol/L (ref 98–111)
Creatinine, Ser: 0.79 mg/dL (ref 0.44–1.00)
GFR calc Af Amer: >60 mL/min (ref >60)
GFR calc non Af Amer: >60 mL/min (ref >60)
Glucose, Bld: 143 mg/dL — ABNORMAL HIGH (ref 70–99)
Potassium: 4.4 mmol/L (ref 3.5–5.1)
Sodium: 135 mmol/L (ref 135–145)
Total Bilirubin: 0.7 mg/dL (ref 0.3–1.2)
Total Protein: 5.4 g/dL — ABNORMAL LOW (ref 6.5–8.1)

## 2019-05-19 LAB — C-REACTIVE PROTEIN: CRP: 0.6 mg/dL (ref <1.0)

## 2019-05-19 LAB — FERRITIN: Ferritin: 310 ng/mL — ABNORMAL HIGH (ref 11–307)

## 2019-05-19 LAB — PHOSPHORUS: Phosphorus: 4.7 mg/dL — ABNORMAL HIGH (ref 2.5–4.6)

## 2019-05-19 LAB — PROCALCITONIN: Procalcitonin: <0.1 ng/mL

## 2019-05-19 MED ORDER — ONDANSETRON HCL 4 MG PO TABS: 4.0000 mg | ORAL_TABLET | Freq: Four times a day (QID) | ORAL | 0 refills | Status: DC | PRN

## 2019-05-19 MED ORDER — AZITHROMYCIN 500 MG PO TABS: 500.0000 mg | ORAL_TABLET | Freq: Every day | ORAL | 0 refills | Status: DC

## 2019-05-19 MED ORDER — CEFDINIR 300 MG PO CAPS: 600.0000 mg | ORAL_CAPSULE | Freq: Every day | ORAL | 0 refills | Status: DC

## 2019-05-19 MED ORDER — DEXAMETHASONE 6 MG PO TABS: 6.0000 mg | ORAL_TABLET | Freq: Every day | ORAL | 0 refills | Status: DC

## 2019-05-19 MED ORDER — PANTOPRAZOLE SODIUM 40 MG PO TBEC: 40.0000 mg | DELAYED_RELEASE_TABLET | Freq: Two times a day (BID) | ORAL | 0 refills | Status: DC

## 2019-05-19 NOTE — Care Management Important Message (Signed)
Important Message  Patient Details  Name: Angelica Barnett MRN: [id number] Date of Birth: [birth year]   Medicare Important Message Given:  Yes - Important Message mailed due to current National Emergency  Verbal consent obtained due to current National Emergency  Relationship to patient: Spouse/Significant Other Contact Name: Kamiylah Meece Call Date: 05/19/19  Time: 1100 Phone: [phone] Outcome: Spoke with contact Important Message mailed to: Patient address on file    [place] 05/19/2019, 11:00 AM

## 2019-05-19 NOTE — Progress Notes (Signed)
Physical Therapy Treatment Patient Details Name: Angelica Barnett MRN: [id number] DOB: 1950-05-26 Today's Date: 05/19/2019    History of Present Illness 69 y.o. female with a history of obesity, HTN, and gout who presented to [facility] ED 1/24 with worsening shortness of breath for 2 days in the setting of having fatigue, cough, and fevers, chills, dyspnea, nausea since 1/17 when she was diagnosed with covid-19.     PT Comments    Very pleasant motivated female patient, being discharged home today. No PT needed at time of discharge. She has been ambulating in room with oxygen at 4 LPM, and will be using oxygen at home. Ambulated to bathroom- able to maintain O2 sats 86->90% throughout session. Reviewed use of IS and Flutter valve unit. Good return demo with both and reminded to continue to use at home. She has HEP for UE/LE- with focus on strengthening and energy conservation.   Follow Up Recommendations  No PT follow up     Equipment Recommendations  None recommended by PT    Recommendations for Other Services       Precautions / Restrictions Precautions Precautions: Fall Restrictions Weight Bearing Restrictions: No    Mobility  Bed Mobility Overal bed mobility: Modified Independent             General bed mobility comments: Sitting in bed at time of PT arrival  Transfers Overall transfer level: Modified independent Equipment used: None Transfers: Sit to/from [facility] Sit to Stand: Modified independent (Device/Increase time) Stand pivot transfers: Modified independent (Device/Increase time)          Ambulation/Gait Ambulation/Gait assistance: Supervision   Assistive device: None Gait Pattern/deviations: Step-through pattern Gait velocity: decreased Gait velocity interpretation: 1.31 - 2.62 ft/sec, indicative of limited community ambulator General Gait Details: 65 feet total today, including to and from bathroom- O2 sats maintained 88-92% 0n 4 LPM,  [place]   Stairs             Wheelchair Mobility    Modified Rankin (Stroke Patients Only)       Balance Overall balance assessment: Mild deficits observed, not formally tested                                          Cognition Arousal/Alertness: Awake/alert Behavior During Therapy: WFL for tasks assessed/performed Overall Cognitive Status: Within Functional Limits for tasks assessed                                 General Comments: She is being discharged today. Goals met. Currently on 4LPM, [place] on portable O2 tank for ambulation      Exercises Other Exercises Other Exercises: incentive spirometer x 10 Other Exercises: flutter valve x 10 Other Exercises: Reminded of proper pursed lip breathing    General Comments General comments (skin integrity, edema, etc.): She will be using oxygen at home, and this has been arranged with [facility]. She is being discharged today.      Pertinent Vitals/Pain Pain Assessment: No/denies pain    Home Living                      Prior Function            PT Goals (current goals can now be found in the care plan section) Acute Rehab PT  Goals Patient Stated Goal: to go home PT Goal Formulation: With patient Time For Goal Achievement: 05/26/19 Potential to Achieve Goals: Good Progress towards PT goals: Goals met/education completed, patient discharged from PT    Frequency           PT Plan Current plan remains appropriate    Co-evaluation              AM-PAC PT "6 Clicks" Mobility   Outcome Measure  Help needed turning from your back to your side while in a flat bed without using bedrails?: None Help needed moving from lying on your back to sitting on the side of a flat bed without using bedrails?: None Help needed moving to and from a bed to a chair (including a wheelchair)?: None Help needed standing up from a chair using your arms (e.g., wheelchair or bedside chair)?:  None Help needed to walk in hospital room?: A Little Help needed climbing 3-5 steps with a railing? : A Little 6 Click Score: 22    End of Session Equipment Utilized During Treatment: Oxygen   Patient left: in chair;with call bell/phone within reach Nurse Communication: Mobility status PT Visit Diagnosis: Other abnormalities of gait and mobility (R26.89);Muscle weakness (generalized) (M62.81)     Time: 4656-8127 PT Time Calculation (min) (ACUTE ONLY): 35 min  Charges:  $Gait Training: 8-22 mins $Therapeutic Exercise: 8-22 mins                    Rollen Sox, PT # [phone] CGV cell   Casandra Doffing 05/19/2019, 12:17 PM

## 2019-05-19 NOTE — Discharge Summary (Addendum)
Physician Discharge Summary  Angelica Barnett [id number] DOB: April 07, 1951 DOA: 05/09/2019  PCP: Dion Body, MD  Admit date: 05/09/2019 Discharge date: 05/19/2019  Time spent: 30 minutes  Recommendations for Outpatient Follow-up:   Covid pneumonia/acute respiratory failure with hypoxia/severe ARDS COVID-19 Labs  Recent Labs    05/18/19 0819 05/19/19 0418  DDIMER 1.74* 2.23*  FERRITIN 318* 310*  CRP <0.5 0.6    SARS-CoV-2 PCR came back positive on 71/17/2020   -Remdesivir course completed -Decadron p.o. 6 mg daily -Actemra x2 doses complete -Vitamins per Covid protocol -Combivent -Flutter valve -Incentive spirometry -Titrate O2 to maintain SPO2> 71% SATURATION QUALIFICATIONS: (Thisnote is usedto comply with regulatory documentation for home oxygen) Patient Saturations on Room Air at Rest =91% Patient Saturations on [employer] while Ambulating =74 Patient Saturations on4Liters of oxygen while Ambulating =87% Please briefly explain why patient needs home oxygen: -Patient meets criteria for home O2 -4 L O2 via [place] titrate to maintain SPO2> 88% -Provide Inogen portable home O2 concentrator -Patient has been advised will be considered contagious until 05/21/2069.  Advised to take appropriate precautions   CAP -Complete 5-day course antibiotics -Monitor procalcitonin  Abnormal EKG -Negative chest pain -1/25 normal echocardiogram Results for Angelica, Barnett (MRN Angelica Barnett:[id number]) as of 05/19/2019 07:12  Ref. Range 05/08/2019 10:16 05/08/2019 17:10 05/08/2019 19:37 05/08/2019 21:22  Troponin I (High Sensitivity) Latest Ref Range: <18 ng/L 7 6 5 4    Essential HTN -Coreg 3.125 mg BID -Losartan 100 mg daily  Morbid obesity (BMI 40.0) -Patient counseled   Gout -Allopurinol 300 mg daily    Discharge Diagnoses:  Principal Problem:   Acute respiratory failure due to severe acute respiratory syndrome coronavirus 2 (SARS-CoV-2) infection (HCC) Active Problems:    Acute respiratory failure with hypoxia (HCC)   Hypertension   GERD (gastroesophageal reflux disease)   Gout   Anemia   Pneumonia due to COVID-19 virus   Abnormal EKG   Discharge Condition: Stable  Diet recommendation: Heart healthy/carb modified  Filed Weights   05/09/19 1756  Weight: 112.6 kg    History of present illness:  Angelica Barnett an 69 y.o.WF PMHx Obesity, essential HTN Gout   P[facility] to [facility]/08/2019 for shortness of breath that started 2 days prior to admission. Her SARS-CoV-2 PCR came back positive on 71/17/2020 on admission in the ED she was found with a temperature of 103 hypoxic and tachycardic, chest x-ray showed bilateral infiltrates CT angio of the chest was negative for PE but it did come for multifocal pneumonia, her hypoxia progressed she was given Tocilizumab on 05/08/2018 and she was started on IV steroids and remdesivir.  Hospital Course:  During his stay patient was treated for COVID pneumonia/acute respiratory failure with hypoxia/severe ARDS.  Patient was treated per Covid protocol responded well to treatment stable for discharge.    Procedures: 1/25 echocardiogram; normal  VENTILATOR SETTINGS: Nasal cannula 2/4 Flow; 3 L/min SPO2 90%   Cultures  SARS-CoV-2 PCR came back positive on 71/17/2020     Antibiotics Anti-infectives (From admission, onward)   Start     Dose/Rate Stop   05/19/19 1000  azithromycin (ZITHROMAX) tablet 500 mg     500 mg 05/21/19 0959   05/19/19 0800  cefdinir (OMNICEF) capsule 600 mg     600 mg 05/22/19 0759   05/18/19 1445  cefixime (SUPRAX) capsule 400 mg  Status:  Discontinued     400 mg 05/18/19 1455   05/16/19 0700  cefTRIAXone (ROCEPHIN) 2 g in sodium chloride  0.9 % 100 mL IVPB  Status:  Discontinued     2 g 200 mL/hr over 30 Minutes 05/18/19 1439   05/16/19 0700  azithromycin (ZITHROMAX) 500 mg in sodium chloride 0.9 % 250 mL IVPB  Status:  Discontinued     500 mg 250 mL/hr over 60 Minutes  05/18/19 1011   05/10/19 1000  remdesivir 100 mg in sodium chloride 0.9 % 100 mL IVPB     100 mg 200 mL/hr over 30 Minutes 05/12/19 0858       Discharge Exam: Vitals:   05/18/19 0827 05/18/19 1515 05/18/19 1940 05/19/19 0332  BP: (!) 124/58 120/61 130/67 134/71  Pulse: 99 83 84 98  Resp:  18 18 20   Temp: 98.8 F (37.1 C)  98 F (36.7 C) 98.4 F (36.9 C)  TempSrc: Tympanic  Oral Oral  SpO2: 94% 91% 90% 90%  Weight:      Height:        General: A/O x4, positive acute respiratory distress Eyes: negative scleral hemorrhage, negative anisocoria, negative icterus ENT: Negative Runny nose, negative gingival bleeding, Neck:  Negative scars, masses, torticollis, lymphadenopathy, JVD Lungs: Clear to auscultation bilaterally without wheezes or crackles Cardiovascular: Regular rate and rhythm without murmur gallop or rub normal S1 and S2   Discharge Instructions   Allergies as of 05/19/2019      Reactions   Celecoxib Hives, Swelling, Anaphylaxis      Medication List    STOP taking these medications   dexamethasone 10 MG/ML injection Commonly known as: DECADRON   enoxaparin 40 MG/0.4ML injection Commonly known as: LOVENOX   indomethacin 50 MG capsule Commonly known as: INDOCIN     TAKE these medications   acetaminophen 500 MG tablet Commonly known as: TYLENOL Take 1,000 mg by mouth every 8 (eight) hours as needed for mild pain.   albuterol 108 (90 Base) MCG/ACT inhaler Commonly known as: VENTOLIN HFA Inhale 2 puffs into the lungs every 6 (six) hours as needed for wheezing or shortness of breath.   allopurinol 300 MG tablet Commonly known as: ZYLOPRIM Take 300 mg by mouth daily.   ascorbic acid 500 MG tablet Commonly known as: VITAMIN C Take 1 tablet (500 mg total) by mouth daily.   azithromycin 500 MG tablet Commonly known as: ZITHROMAX Take 1 tablet (500 mg total) by mouth daily.   calcium-vitamin D 500-200 MG-UNIT tablet Take 1 tablet by mouth daily.    carvedilol 3.125 MG tablet Commonly known as: COREG Take 3.125 mg by mouth 2 (two) times daily.   cefdinir 300 MG capsule Commonly known as: OMNICEF Take 2 capsules (600 mg total) by mouth daily.   cetirizine 10 MG tablet Commonly known as: ZYRTEC Take 10 mg by mouth at bedtime.   chlorpheniramine-HYDROcodone 10-8 MG/5ML [place] Commonly known as: TUSSIONEX Take 5 mLs by mouth every 12 (twelve) hours as needed for cough.   dexamethasone 6 MG tablet Commonly known as: DECADRON Take 1 tablet (6 mg total) by mouth daily.   guaiFENesin-dextromethorphan 100-10 MG/5ML syrup Commonly known as: ROBITUSSIN DM Take 10 mLs by mouth every 4 (four) hours as needed for cough.   losartan 100 MG tablet Commonly known as: COZAAR Take 100 mg by mouth daily.   ondansetron 4 MG tablet Commonly known as: ZOFRAN Take 1 tablet (4 mg total) by mouth every 6 (six) hours as needed for nausea.   pantoprazole 40 MG tablet Commonly known as: PROTONIX Take 1 tablet (40 mg total) by mouth 2 (two)  times daily.   zinc sulfate 220 (50 Zn) MG capsule Take 1 capsule (220 mg total) by mouth daily.            Durable Medical Equipment  (From admission, onward)         Start     Ordered   05/18/19 1454  For home use only DME oxygen  Once    Comments: SATURATION QUALIFICATIONS: (This note is used to comply with regulatory documentation for home oxygen) Patient Saturations on Room Air at Rest = 91% Patient Saturations on Room Air while Ambulating = 74 Patient Saturations on 4 Liters of oxygen while Ambulating = 87% Please briefly explain why patient needs home oxygen: -Patient meets criteria for home O2 -4 L O2 via [place] titrate to maintain SPO2> 8% -Provide Inogen portable home O2 concentrator  Question Answer Comment  Length of Need 12 Months   Mode or (Route) Nasal cannula   Liters per Minute 4   Frequency Continuous (stationary and portable oxygen unit needed)   Oxygen conserving device Yes    Oxygen delivery system Gas      05/18/19 1453         Allergies  Allergen Reactions  . Celecoxib Hives, Swelling and Anaphylaxis      The results of significant diagnostics from this hospitalization (including imaging, microbiology, ancillary and laboratory) are listed below for reference.    Significant Diagnostic Studies: CT CHEST WO CONTRAST  Result Date: 05/15/2019 CLINICAL DATA:  Dyspnea on exertion.  COVID-19 positive. EXAM: CT CHEST WITHOUT CONTRAST TECHNIQUE: Multidetector CT imaging of the chest was performed following the standard protocol without IV contrast. COMPARISON:  05/08/2019 FINDINGS: Cardiovascular: Borderline cardiomegaly unchanged. Thoracic aorta is normal in caliber. Mild stable prominence of the right main pulmonary artery. Remaining vascular structures are unremarkable. Mediastinum/Nodes: No mediastinal or hilar adenopathy. Remaining mediastinal structures are unremarkable. Lungs/Pleura: Lungs are adequately inflated demonstrate diffuse bilateral patchy ground-glass and crazy paving pattern of airspace attenuation compatible multifocal pneumonia likely viral in origin in this COVID-19 positive patient. There is been interval worsening over the lower lobes. No effusion. Airways are normal. Upper Abdomen: Cholelithiasis.  No acute findings. Musculoskeletal: No acute findings. IMPRESSION: 1. Persistent bilateral patchy airspace process with slight interval worsening over the lower lobes compatible with multifocal pneumonia likely of viral origin in this known COVID-19 positive patient. 2.  Cholelithiasis. Electronically Signed   By: Marin Olp M.D.   On: 05/15/2019 09:34   CT ANGIO CHEST PE W OR WO CONTRAST  Result Date: 05/08/2019 CLINICAL DATA:  Shortness of breath, difficulty breathing EXAM: CT ANGIOGRAPHY CHEST WITH CONTRAST TECHNIQUE: Multidetector CT imaging of the chest was performed using the standard protocol during bolus administration of intravenous  contrast. Multiplanar CT image reconstructions and MIPs were obtained to evaluate the vascular anatomy. CONTRAST:  128mL OMNIPAQUE IOHEXOL 350 MG/ML SOLN COMPARISON:  None. FINDINGS: Cardiovascular: Satisfactory opacification of the pulmonary arteries to the segmental level. No evidence of pulmonary embolism. Mildly enlarged heart size. No pericardial effusion. Mediastinum/Nodes: No enlarged hilar, or axillary lymph nodes. Enlarged subcarinal lymph measuring 13 mm. Multiple small right paratracheal lymph nodes the largest measuring 9 mm. Thyroid gland, trachea, and esophagus demonstrate no significant findings. Lungs/Pleura: Bilateral patchy areas of ground-glass opacity throughout the lung fields. No pleural effusion or pneumothorax. No pneumomediastinum. Upper Abdomen: No acute abnormality. Musculoskeletal: No acute osseous abnormality. No aggressive osseous lesion. Review of the MIP images confirms the above findings. IMPRESSION: 1. No evidence of pulmonary embolus. 2.  Bilateral patchy ground-glass opacities throughout the lungs as can be seen with atypical viral pneumonia given the patient's history of testing positive for COVID-19. Electronically Signed   By: Kathreen Devoid   On: 05/08/2019 16:59   DG CHEST PORT 1 VIEW  Result Date: 05/14/2019 CLINICAL DATA:  Dyspnea. EXAM: PORTABLE CHEST 1 VIEW COMPARISON:  05/09/2019 FINDINGS: Lungs are adequately inflated demonstrate patchy hazy bilateral airspace opacification over the mid to lower lungs without significant change likely multifocal pneumonia. No evidence of effusion. Cardiomediastinal silhouette and remainder of the exam is unchanged. IMPRESSION: Stable hazy patchy multifocal airspace process over the mid to lower lungs likely multifocal pneumonia. Electronically Signed   By: Marin Olp M.D.   On: 05/14/2019 10:22   Portable chest 1 View  Result Date: 05/09/2019 CLINICAL DATA:  69 year old female with history of shortness of breath. EXAM: PORTABLE  CHEST 1 VIEW COMPARISON:  Chest x-ray 05/08/2019. FINDINGS: Lung volumes are low. Patchy multifocal ill-defined airspace disease and interstitial prominence throughout the mid to lower lungs bilaterally which remains highly concerning for severe multilobar bilateral pneumonia. No pleural effusions. No pneumothorax. No evidence of pulmonary edema. Heart size is normal. Upper mediastinal contours are within normal limits. IMPRESSION: 1. The appearance of the lungs is compatible with severe multilobar bilateral pneumonia, likely related to COVID-19 infection. Electronically Signed   By: Vinnie Langton M.D.   On: 05/09/2019 14:35   DG Chest Port 1 View  Result Date: 05/08/2019 CLINICAL DATA:  Shortness of breath, COVID-19 positive EXAM: PORTABLE CHEST 1 VIEW COMPARISON:  None. FINDINGS: Bilateral interstitial and patchy alveolar airspace opacities. No pleural effusion or pneumothorax. No cardiomediastinal abnormality. No acute osseous abnormality. IMPRESSION: Bilateral interstitial and patchy alveolar airspace opacities concerning for multilobar pneumonia including atypical viral pneumonia. Electronically Signed   By: Kathreen Devoid   On: 05/08/2019 09:29   ECHOCARDIOGRAM LIMITED  Result Date: 05/09/2019   ECHOCARDIOGRAM LIMITED REPORT   Patient Name:   Angelica Barnett Date of Exam: 05/09/2019 Medical Rec #:  [place]:[id number]       Height:       65.0 in Accession #:    [id number]      Weight:       248.2 lb Date of Birth:  Jul 01, 1950       BSA:          2.17 m Patient Age:    46 years        BP:           142/58 mmHg Patient Gender: F               HR:           94 bpm. Exam Location:  [facility]  Procedure: Limited Echo and Limited Color Doppler Indications:     R94.31 Abnormal ECG  History:         Patient has no prior history of Echocardiogram examinations. Pt                  tested positive for novel coronavirus on 05/08/2019.  Sonographer:     Charmayne Sheer RDCS (AE) Referring Phys:  [place] Diagnosing Phys:  Serafina Royals MD  Sonographer Comments: Technically difficult study due to poor echo windows. Image acquisition challenging due to patient body habitus. IMPRESSIONS  1. The left ventricle has normal function. There is no increased left ventricular wall thickness.  2. The left ventricle has no regional wall motion abnormalities.  3. Global right  ventricle has normal systolc function.The right ventricular size is normal. no increase in right ventricular wall thickness.  4. The mitral valve is normal in structure. Trivial mitral valve regurgitation.  5. The tricuspid valve was normal in structure. Tricuspid valve regurgitation is trivial.  6. Tricuspid valve regurgitation is trivial.  7. The pulmonic valve was normal in structure. FINDINGS  Left Ventricle: The left ventricle has normal function. The left ventricle has no regional wall motion abnormalities. The left ventricular internal cavity size was the left ventricle is normal in size. There is no increased left ventricular wall thickness. Right Ventricle: The right ventricular size is normal. No increase in right ventricular wall thickness. Global RV systolic function is has normal systolic function. Left Atrium: Left atrial size was normal in size. Right Atrium: Right atrial size was normal in size. Right atrial pressure is estimated at 10 mmHg. Pericardium: There is no evidence of pericardial effusion is seen. There is no evidence of pericardial effusion. Mitral Valve: The mitral valve is normal in structure. Trivial mitral valve regurgitation. Tricuspid Valve: The tricuspid valve is normal in structure. Tricuspid valve regurgitation is trivial. Aortic Valve: The aortic valve is normal in structure. Aortic valve regurgitation is trivial. Pulmonic Valve: The pulmonic valve was normal in structure. Pulmonic valve regurgitation is not visualized by color flow Doppler. Pulmonic regurgitation is not visualized by color flow Doppler. Aorta: The aortic root, ascending  aorta and aortic arch are all structurally normal, with no evidence of dilitation or obstruction. Shunts: No atrial level shunt detected by color flow Doppler.  LEFT VENTRICLE         Normals PLAX 2D LVIDd:         5.33 cm 3.6 cm LVIDs:         3.53 cm 1.7 cm LV PW:         0.82 cm 1.4 cm LV IVS:        0.74 cm 1.3 cm LV SV:         85 ml   79 ml LV SV Index:   36.56   45 ml/m2  LEFT ATRIUM         Index LA diam:    3.80 cm 1.75 cm/m   AORTA                 Normals Ao Root diam: 3.00 cm 31 mm  Serafina Royals MD Electronically signed by Serafina Royals MD Signature Date/Time: 05/09/2019/1:07:36 PMThe mitral valve is normal in structure.    Final     Microbiology: No results found for this or any previous visit (from the past 240 hour(s)).   Labs: Basic Metabolic Panel: Recent Labs  Lab 05/14/19 0251 05/15/19 0356 05/16/19 0341 05/18/19 0819 05/19/19 0418  NA 135 136 134* 136 135  K 4.4 4.3 4.6 4.7 4.4  CL 97* 95* 94* 99 99  CO2 28 29 28 27 27   GLUCOSE 145* 123* 128* 115* 143*  BUN 22 22 21 17 20   CREATININE 0.75 0.70 0.74 0.68 0.79  CALCIUM 8.2* 8.2* 8.3* 8.3* 8.3*  MG  --   --   --  2.3 2.3  PHOS  --   --   --  5.0* 4.7*   Liver Function Tests: Recent Labs  Lab 05/18/19 0819 05/19/19 0418  AST 18 18  ALT 26 28  ALKPHOS 47 45  BILITOT 0.5 0.7  PROT 5.7* 5.4*  ALBUMIN 3.0* 2.9*   No results for input(s): LIPASE, AMYLASE in  the last 168 hours. No results for input(s): AMMONIA in the last 168 hours. CBC: Recent Labs  Lab 05/14/19 0251 05/15/19 0356 05/17/19 0855 05/18/19 0819 05/19/19 0418  WBC 8.8 10.9* 12.8* 10.9* 8.0  NEUTROABS 7.8* 9.7* 11.3* 9.5* 7.1  HGB 12.8 13.2 14.1 13.1 12.5  HCT 39.0 39.8 42.5 39.3 38.0  MCV 96.8 95.9 95.1 95.4 95.0  PLT 411* 400 398 357 353   Cardiac Enzymes: No results for input(s): CKTOTAL, CKMB, CKMBINDEX, TROPONINI in the last 168 hours. BNP: BNP (last 3 results) Recent Labs    05/08/19 0911  BNP 55.0    ProBNP (last 3  results) No results for input(s): PROBNP in the last 8760 hours.  CBG: No results for input(s): GLUCAP in the last 168 hours.     Signed:  Dia Crawford, MD [facility] [phone] pager

## 2019-05-19 NOTE — TOC Transition Note (Addendum)
Transition of Care [facility]) - CM/SW Discharge Note   Patient Details  Name: Angelica Barnett MRN: [place]:[id number] Date of Birth: [birth year]  [facility] CM/SW Contact:  Atilano Median, LCSW Phone Number: 05/19/2019, 12:48 PM   Clinical Narrative:    Discharged home with oxygen. Portable tank delivered to room by [facility] RN. Concentrator delivered to home and received by patient's husband.   Family to provide transport home.   Patient aware and agreeable to this plan. No other needs at this time. Case closed to this CSW.    Final next level of care: Home/Self Care Barriers to Discharge: Barriers Resolved   Patient Goals and CMS Choice   CMS Medicare.gov Compare Post Acute Care list provided to:: Patient Choice offered to / list presented to : Patient  Discharge Placement                       Discharge Plan and Services In-house Referral: Clinical Social Work   Post Acute Care Choice: Durable Medical Equipment          DME Arranged: Oxygen DME Agency: [employer] Date DME Agency Contacted: 05/18/19 Time DME Agency Contacted: [id number] Representative spoke with at DME Agency: [place] ([place]) Interventions     Readmission Risk Interventions No flowsheet data found.

## 2019-07-08 ENCOUNTER — Other Ambulatory Visit: Payer: Self-pay

## 2019-10-10 ENCOUNTER — Other Ambulatory Visit: Payer: Self-pay | Admitting: Family Medicine

## 2019-10-10 DIAGNOSIS — Z1231 Encounter for screening mammogram for malignant neoplasm of breast: Secondary | ICD-10-CM

## 2019-11-08 ENCOUNTER — Ambulatory Visit
Admission: RE | Admit: 2019-11-08 | Discharge: 2019-11-08 | Disposition: A | Discharge status: Home / Self Care | Payer: Medicare Other | Source: Ambulatory Visit | Attending: Family Medicine | Admitting: Family Medicine

## 2019-11-08 DIAGNOSIS — Z1231 Encounter for screening mammogram for malignant neoplasm of breast: Secondary | ICD-10-CM | POA: Diagnosis not present

## 2021-01-08 ENCOUNTER — Other Ambulatory Visit
Admission: RE | Admit: 2021-01-08 | Discharge: 2021-01-08 | Disposition: A | Discharge status: Home / Self Care | Payer: Medicare Other | Source: Ambulatory Visit | Attending: Pulmonary Disease | Admitting: Pulmonary Disease

## 2021-01-08 DIAGNOSIS — R0600 Dyspnea, unspecified: Secondary | ICD-10-CM | POA: Diagnosis present

## 2021-01-08 LAB — D-DIMER, QUANTITATIVE: D-Dimer, Quant: 0.57 ug/mL-FEU — ABNORMAL HIGH (ref 0.00–0.50)

## 2021-01-11 ENCOUNTER — Other Ambulatory Visit: Payer: Self-pay | Admitting: Pulmonary Disease

## 2021-01-11 DIAGNOSIS — R7989 Other specified abnormal findings of blood chemistry: Secondary | ICD-10-CM

## 2021-01-17 ENCOUNTER — Other Ambulatory Visit: Payer: Self-pay | Admitting: Family Medicine

## 2021-01-17 DIAGNOSIS — Z1231 Encounter for screening mammogram for malignant neoplasm of breast: Secondary | ICD-10-CM

## 2021-01-21 ENCOUNTER — Other Ambulatory Visit: Payer: Self-pay

## 2021-01-21 ENCOUNTER — Ambulatory Visit
Admission: RE | Admit: 2021-01-21 | Discharge: 2021-01-21 | Disposition: A | Discharge status: Home / Self Care | Payer: Medicare Other | Source: Ambulatory Visit | Attending: Family Medicine | Admitting: Family Medicine

## 2021-01-21 DIAGNOSIS — Z1231 Encounter for screening mammogram for malignant neoplasm of breast: Secondary | ICD-10-CM | POA: Diagnosis not present

## 2021-04-04 ENCOUNTER — Ambulatory Visit: Payer: Medicare Other

## 2021-04-05 ENCOUNTER — Other Ambulatory Visit: Payer: Self-pay

## 2021-04-05 ENCOUNTER — Ambulatory Visit
Admission: RE | Admit: 2021-04-05 | Discharge: 2021-04-05 | Disposition: A | Discharge status: Home / Self Care | Payer: Medicare Other | Source: Ambulatory Visit | Attending: Pulmonary Disease | Admitting: Pulmonary Disease

## 2021-04-05 DIAGNOSIS — R7989 Other specified abnormal findings of blood chemistry: Secondary | ICD-10-CM | POA: Diagnosis not present

## 2021-04-05 MED ORDER — IOHEXOL 350 MG/ML SOLN
75.0000 mL | Freq: Once | INTRAVENOUS | Status: AC | PRN
  Administered 2021-04-05: 16:00:00 75 mL via INTRAVENOUS

## 2021-04-16 IMAGING — DX DG CHEST 1V PORT
1 series · 1 of 1 positions shown · non-contrast
Comparison: Chest x-ray 05/08/2019.

CLINICAL DATA: 68-year-old female with history of shortness of
breath.

EXAM:
PORTABLE CHEST 1 VIEW

[chest]
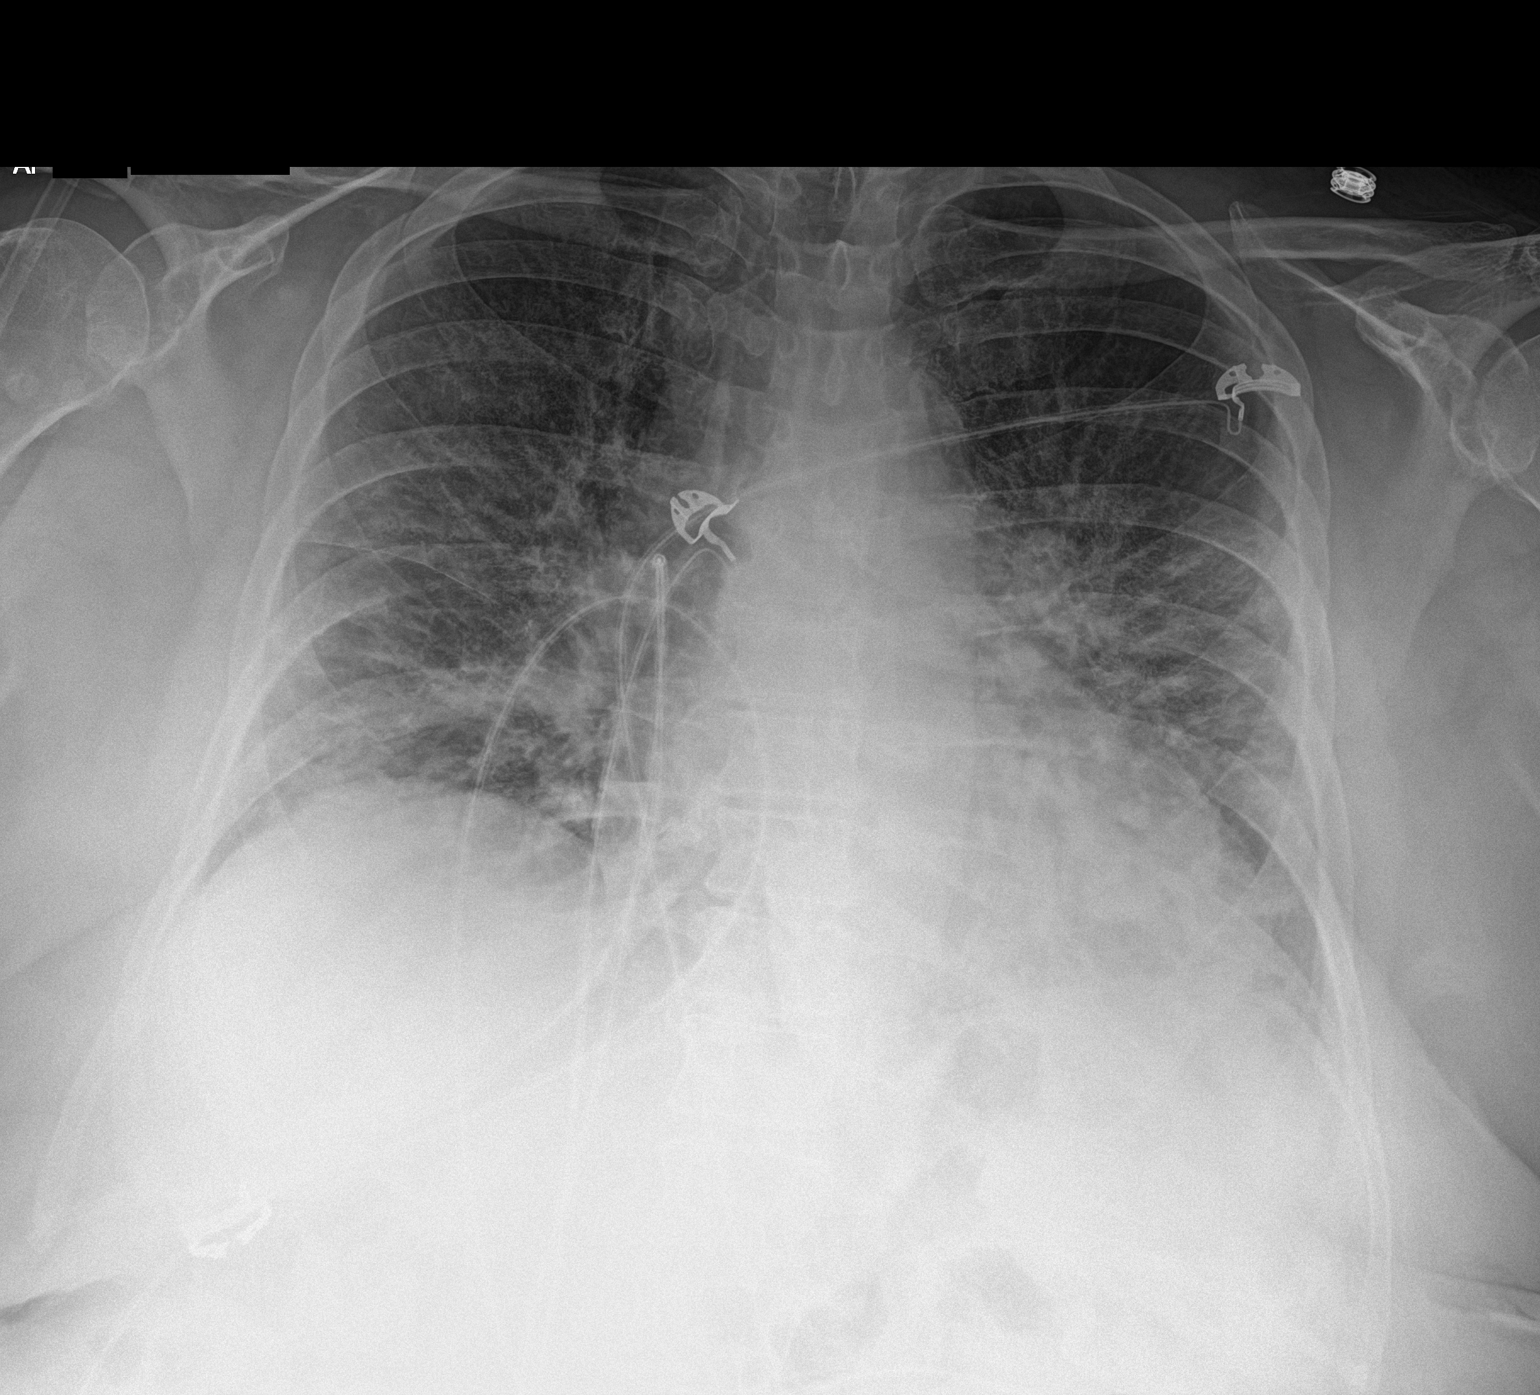

[1 of 1 positions shown; findings below may reference images not displayed]

FINDINGS: Lung volumes are low. Patchy multifocal ill-defined airspace disease
and interstitial prominence throughout the mid to lower lungs
bilaterally which remains highly concerning for severe multilobar
bilateral pneumonia. No pleural effusions. No pneumothorax. No
evidence of pulmonary edema. Heart size is normal. Upper mediastinal
contours are within normal limits.
IMPRESSION: 1. The appearance of the lungs is compatible with severe multilobar
bilateral pneumonia, likely related to [id number] infection.

## 2021-11-07 ENCOUNTER — Other Ambulatory Visit: Payer: Self-pay | Admitting: Orthopedic Surgery

## 2021-11-07 DIAGNOSIS — M1711 Unilateral primary osteoarthritis, right knee: Secondary | ICD-10-CM

## 2021-11-07 DIAGNOSIS — M25561 Pain in right knee: Secondary | ICD-10-CM

## 2021-11-18 ENCOUNTER — Ambulatory Visit
Admission: RE | Admit: 2021-11-18 | Discharge: 2021-11-18 | Disposition: A | Discharge status: Home / Self Care | Payer: Medicare Other | Source: Ambulatory Visit | Attending: Orthopedic Surgery | Admitting: Orthopedic Surgery

## 2021-11-18 DIAGNOSIS — M1711 Unilateral primary osteoarthritis, right knee: Secondary | ICD-10-CM | POA: Insufficient documentation

## 2021-11-18 DIAGNOSIS — M25561 Pain in right knee: Secondary | ICD-10-CM | POA: Diagnosis present

## 2021-12-03 NOTE — Discharge Instructions (Addendum)
Instructions after Knee Arthroscopy    James P. Hooten, Jr., M.D.     Dept. of Orthopaedics & Sports Medicine  [facility]  [address]  [place], [place]  [address]   Phone: [phone]   Fax: [phone]   DIET: Drink plenty of non-alcoholic fluids & begin a light diet. Resume your normal diet the day after surgery.  ACTIVITY:  You may use crutches or a walker with weight-bearing as tolerated, unless instructed otherwise. You may wean yourself off of the walker or crutches as tolerated.  Begin doing gentle exercises. Exercising will reduce the pain and swelling, increase motion, and prevent muscle weakness.   Avoid strenuous activities or athletics for a minimum of 4-6 weeks after arthroscopic surgery. Do not drive or operate any equipment until instructed.  WOUND CARE:  Place one to two pillows under the knee the first day or two when sitting or lying.  Continue to use the ice packs periodically to reduce pain and swelling. The small incisions in your knee are closed with nylon stitches. The stitches will be removed in the office. The bulky dressing may be removed on the second day after surgery. DO NOT TOUCH THE STITCHES. Put a Band-Aid over each stitch. Do NOT use any ointments or creams on the incisions.  You may bathe or shower after the stitches are removed at the first office visit following surgery.  MEDICATIONS: You may resume your regular medications. Please take the pain medication as prescribed. Do not take pain medication on an empty stomach. Do not drive or drink alcoholic beverages when taking pain medications.  CALL THE OFFICE FOR: Temperature above 101 degrees Excessive bleeding or drainage on the dressing. Excessive swelling, coldness, or paleness of the toes. Persistent nausea and vomiting.  FOLLOW-UP:  You should have an appointment to return to the office in 7-10 days after surgery.      [facility] Department Directory          [email or web address]       [email or web address]          Cardiology  Appointments: [place] - [phone] [place] - [phone]  Endocrinology  Appointments: [place] - [phone] [place] - [phone]  Gastroenterology  Appointments: [place] - [phone] [place] - [phone]        General Surgery   Appointments: [place] - [phone]  Internal Medicine/Family Medicine  Appointments: [place] - [phone] [place] - [phone] [place] - [phone]  Metabolic and Weigh Loss Surgery  Appointments: [place] - [phone]        Neurology  Appointments: [place] - [phone] [place] - [phone]  Neurosurgery  Appointments: [place] - [phone]  Obstetrics & Gynecology  Appointments: [place] - [phone] [place] - [phone]        Pediatrics  Appointments: [place] - [phone] [place] - [phone]  Physiatry  Appointments: [place] -[phone]  Physical Therapy  Appointments: [place] - [phone] [place] - [phone]        Podiatry  Appointments: [place] - [phone] [place] - [phone]  Pulmonology  Appointments: [place] - [phone]  Rheumatology  Appointments: [place] - [phone]        [place] Location: [facility]  [address] [place], [place]  [address]  [place] Location: [facility] [address] [place], [place]  [address]  [place] Location: [facility] [address] [place], [place]  [address]      AMBULATORY SURGERY  DISCHARGE INSTRUCTIONS   The drugs that you were given will stay in your system until tomorrow so for the next 24   hours you should not:  Drive an automobile Make any legal decisions Drink any alcoholic beverage   You may resume regular meals tomorrow.  Today it is better to start with liquids and gradually work up to solid foods.  You may eat anything you prefer, but it is better to  start with liquids, then soup and crackers, and gradually work up to solid foods.   Please notify your doctor immediately if you have any unusual bleeding, trouble breathing, redness and pain at the surgery site, drainage, fever, or pain not relieved by medication.    Additional Instructions:   Please contact your physician with any problems or Same Day Surgery at [phone], Monday through Friday 6 am to 4 pm, or [place] at  Main number at [phone]. 

## 2021-12-04 ENCOUNTER — Encounter
Admission: RE | Admit: 2021-12-04 | Discharge: 2021-12-04 | Disposition: A | Discharge status: Home / Self Care | Payer: Medicare Other | Source: Ambulatory Visit | Attending: Orthopedic Surgery | Admitting: Orthopedic Surgery

## 2021-12-04 VITALS — Ht 64.0 in | Wt 235.0 lb

## 2021-12-04 DIAGNOSIS — I10 Essential (primary) hypertension: Secondary | ICD-10-CM

## 2021-12-04 DIAGNOSIS — Z01812 Encounter for preprocedural laboratory examination: Secondary | ICD-10-CM

## 2021-12-04 HISTORY — DX: Polyp of colon: K63.5

## 2021-12-04 HISTORY — DX: Cortical age-related cataract, bilateral: H25.013

## 2021-12-04 HISTORY — DX: Pure hypercholesterolemia, unspecified: E78.00

## 2021-12-04 NOTE — Patient Instructions (Addendum)
Your procedure is scheduled on: Monday, August 28 Report to the Registration Desk on the 1st floor of the [facility]. To find out your arrival time, please call [phone] between 1PM - 3PM on: Friday, August 25 If your arrival time is 6:00 am, do not arrive prior to that time as the [place] entrance doors do not open until 6:00 am.  REMEMBER: Instructions that are not followed completely may result in serious medical risk, up to and including death; or upon the discretion of your surgeon and anesthesiologist your surgery may need to be rescheduled.  Do not eat food after midnight the night before surgery.  No gum chewing, lozengers or hard candies.  You may however, drink CLEAR liquids up to 2 hours before you are scheduled to arrive for your surgery. Do not drink anything within 2 hours of your scheduled arrival time.  Clear liquids include: - water  - apple juice without pulp - gatorade (not RED colors) - black coffee or tea (Do NOT add milk or creamers to the coffee or tea) Do NOT drink anything that is not on this list.  In addition, your doctor has ordered for you to drink the provided  Ensure Pre-Surgery Clear Carbohydrate Drink  Drinking this carbohydrate drink up to two hours before surgery helps to reduce insulin resistance and improve patient outcomes. Please complete drinking 2 hours prior to scheduled arrival time.  TAKE THESE MEDICATIONS THE MORNING OF SURGERY WITH A SIP OF WATER:  Albuterol inhaler Carvedilol  One week prior to surgery: starting today, August 23 Stop Anti-inflammatories (NSAIDS) such as Advil, Aleve, Ibuprofen, Motrin, Naproxen, Naprosyn and Aspirin based products such as Excedrin, Goodys Powder, BC Powder. Stop ANY OVER THE COUNTER supplements until after surgery. Stop Vitamin D, calcium You may however, continue to take Tylenol if needed for pain up until the day of surgery.  No Alcohol for 24 hours before or after surgery.  No Smoking  including e-cigarettes for 24 hours prior to surgery.  No chewable tobacco products for at least 6 hours prior to surgery.  No nicotine patches on the day of surgery.  Do not use any "recreational" drugs for at least a week prior to your surgery.  Please be advised that the combination of cocaine and anesthesia may have negative outcomes, up to and including death. If you test positive for cocaine, your surgery will be cancelled.  On the morning of surgery brush your teeth with toothpaste and water, you may rinse your mouth with mouthwash if you wish. Do not swallow any toothpaste or mouthwash.  Use CHG Soap as directed on instruction sheet.  Do not wear jewelry, make-up, hairpins, clips or nail polish.  Do not wear lotions, powders, or perfumes.   Do not shave body from the neck down 48 hours prior to surgery just in case you cut yourself which could leave a site for infection.  Also, freshly shaved skin may become irritated if using the CHG soap.  Contact lenses, hearing aids and dentures may not be worn into surgery.  Do not bring valuables to the hospital. [facility] is not responsible for any missing/lost belongings or valuables.   Notify your doctor if there is any change in your medical condition (cold, fever, infection).  Wear comfortable clothing (specific to your surgery type) to the hospital.  After surgery, you can help prevent lung complications by doing breathing exercises.  Take deep breaths and cough every 1-2 hours. Your doctor may order a  device called an Incentive Spirometer to help you take deep breaths.  If you are being discharged the day of surgery, you will not be allowed to drive home. You will need a responsible adult (18 years or older) to drive you home and stay with you that night.   If you are taking public transportation, you will need to have a responsible adult (18 years or older) with you. Please confirm with your physician that it is acceptable  to use public transportation.   Please call the [place] Dept. at [phone] if you have any questions about these instructions.  Surgery Visitation Policy:  Patients undergoing a surgery or procedure may have two family members or support persons with them as long as the person is not COVID-19 positive or experiencing its symptoms.      Preparing for Surgery with [place]rania (CHG) Soap  Before surgery, you can play an important role by reducing the number of germs on your skin.  CHG (Chlorhexidine gluconate) soap is an antiseptic cleanser which kills germs and bonds with the skin to continue killing germs even after washing.  Please do not use if you have an allergy to CHG or antibacterial soaps. If your skin becomes reddened/irritated stop using the CHG.  1. Shower the NIGHT BEFORE SURGERY and the MORNING OF SURGERY with CHG soap.  2. If you choose to wash your hair, wash your hair first as usual with your normal shampoo.  3. After shampooing, rinse your hair and body thoroughly to remove the shampoo.  4. Use CHG as you would any other liquid soap. You can apply CHG directly to the skin and wash gently with a scrungie or a clean washcloth.  5. Apply the CHG soap to your body only from the neck down. Do not use on open wounds or open sores. Avoid contact with your eyes, ears, mouth, and genitals (private parts). Wash face and genitals (private parts) with your normal soap.  6. Wash thoroughly, paying special attention to the area where your surgery will be performed.  7. Thoroughly rinse your body with warm water.  8. Do not shower/wash with your normal soap after using and rinsing off the CHG soap.  9. Pat yourself dry with a clean towel.  10. Wear clean pajamas to bed the night before surgery.  12. Place clean sheets on your bed the night of your first shower and do not sleep with pets.  13. Shower again with the CHG soap on the day of surgery  prior to arriving at the hospital.  14. Do not apply any deodorants/lotions/powders.  15. Please wear clean clothes to the hospital.

## 2021-12-05 ENCOUNTER — Encounter
Admission: RE | Admit: 2021-12-05 | Discharge: 2021-12-05 | Disposition: A | Discharge status: Home / Self Care | Payer: Medicare Other | Source: Ambulatory Visit | Attending: Orthopedic Surgery | Admitting: Orthopedic Surgery

## 2021-12-05 DIAGNOSIS — Z01818 Encounter for other preprocedural examination: Secondary | ICD-10-CM | POA: Insufficient documentation

## 2021-12-05 DIAGNOSIS — I10 Essential (primary) hypertension: Secondary | ICD-10-CM | POA: Insufficient documentation

## 2021-12-05 DIAGNOSIS — Z01812 Encounter for preprocedural laboratory examination: Secondary | ICD-10-CM

## 2021-12-05 LAB — CBC
HCT: 42.4 % (ref 36.0–46.0)
Hemoglobin: 13.9 g/dL (ref 12.0–15.0)
MCH: 32.1 pg (ref 26.0–34.0)
MCHC: 32.8 g/dL (ref 30.0–36.0)
MCV: 97.9 fL (ref 80.0–100.0)
Platelets: 306 10*3/uL (ref 150–400)
RBC: 4.33 MIL/uL (ref 3.87–5.11)
RDW: 12.8 % (ref 11.5–15.5)
WBC: 7.1 10*3/uL (ref 4.0–10.5)
nRBC: 0 % (ref 0.0–0.2)

## 2021-12-05 LAB — BASIC METABOLIC PANEL
Anion gap: 9 (ref 5–15)
BUN: 16 mg/dL (ref 8–23)
CO2: 27 mmol/L (ref 22–32)
Calcium: 9.1 mg/dL (ref 8.9–10.3)
Chloride: 103 mmol/L (ref 98–111)
Creatinine, Ser: 0.76 mg/dL (ref 0.44–1.00)
GFR, Estimated: >60 mL/min (ref >60)
Glucose, Bld: 98 mg/dL (ref 70–99)
Potassium: 4.3 mmol/L (ref 3.5–5.1)
Sodium: 139 mmol/L (ref 135–145)

## 2021-12-06 NOTE — Progress Notes (Addendum)
Perioperative Services Pre-Admission/Anesthesia Testing    Date: 12/06/21  Name: Angelica Barnett MRN:   [id number]  Re: Clearance for surgery (pulmonary medicine)  Planned Surgical Procedure(s):    Case: [id number] Date/Time: 12/09/21 1525   Procedure: ARTHROSCOPY KNEE (Right: Knee)   Anesthesia type: Choice   Pre-op diagnosis: INTERNAL DERANGEMENT OF RIGHT KNEE.   Location: [facility] OR ROOM 01 / [place] [facility] AN[facility]   Surgeons: Dereck Leep, MD   Clinical Notes:  Patient is scheduled for the above procedure on 12/09/2021 with Dr. Skip Estimable, MD.  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  Patient was admitted to [facility] in [place] for treatment of her SARS-CoV-2 infection. She has been oxygen dependent and being followed by pulmonary medicine Lanney Gins, MD).  It has been several months and patient has been seen by pulmonary medicine.  Given the amount of time since her last visit, provider unsure of current status.  MD to see patient in the office for preoperative evaluation and clearance.  Patient was seen in the pulmonary medicine clinic on 12/05/2021; notes reviewed.  At the time of her clinic visit, patient doing well overall from a pulmonary medicine standpoint.  Breathing had markedly improved and lungs were clear.  PFTs revealed an FEV1 of 109% (previously 104% in 11/2020).  MD noted that patient with intermittent issues with hypoxemia and has supplemental oxygen to use on a as needed basis.  Per MD notes, patient does not require the use of the prescribed supplemental oxygen. MD recommended 32 ounce fluid restriction daily and weight loss.  No other changes were made to her plan of care.  Patient follow-up with outpatient pulmonary medicine in 3 months or sooner if needed  Regarding her upcoming surgery.  Patient has  been cleared to proceed at an overall low risk of significant perioperative complications based on clinical status, physical exam, and stratification using the following models:  ARISCAT (Canet) preoperative pulmonary risk index in adults- low risk: 1.6% pulmonary postoperative complication rate  [place]ullah respiratory failure index- low risk: 1.8% pulmonary postoperative complication rate  Lyndel Safe calculator for postoperative respiratory failure- low risk: 1.07% probability of postoperative respiratory failure  Impression and Plan:  Angelica Barnett has been referred for pre-anesthesia review and clearance prior to her undergoing the planned anesthetic and procedural courses. Available labs, pertinent testing, and imaging results were personally reviewed by me. This patient has been appropriately cleared by pulmonary medicine with an overall LOW Risk of significant perioperative cardiopulmonary complications.  Based on clinical review performed today (12/06/21), barring any significant acute changes in the patient's overall condition, it is anticipated that she will be able to proceed with the planned surgical intervention. Any acute changes in clinical condition may necessitate her procedure being postponed and/or cancelled. Patient will meet with anesthesia team (MD and/or CRNA) on this day of her procedure for preoperative evaluation/assessment.   Pre-surgical instructions were reviewed with the patient during her PAT appointment and questions were fielded by PAT clinical staff. Patient was advised that if any questions or concerns arise prior to her procedure then she should return a call to PAT and/or her surgeon's office to discuss.  Honor Loh, MSN, APRN, FNP-C, CEN [facility]  Peri-operative Services Nurse Practitioner Phone: [phone] 12/06/21 8:50 AM  NOTE: This note has been prepared using Dragon dictation software. Despite my best  ability to proofread, there is  always the potential that unintentional transcriptional errors may still occur from this process.

## 2021-12-09 ENCOUNTER — Other Ambulatory Visit: Payer: Self-pay

## 2021-12-09 ENCOUNTER — Encounter
Admission: RE | Disposition: A | Discharge status: Home / Self Care | Payer: Self-pay | Source: Home / Self Care | Attending: Orthopedic Surgery

## 2021-12-09 ENCOUNTER — Encounter: Payer: Self-pay | Admitting: Orthopedic Surgery

## 2021-12-09 ENCOUNTER — Ambulatory Visit: Payer: Medicare Other | Admitting: Urgent Care

## 2021-12-09 ENCOUNTER — Ambulatory Visit
Admission: RE | Admit: 2021-12-09 | Discharge: 2021-12-09 | Disposition: A | Discharge status: Home / Self Care | Payer: Medicare Other | Attending: Orthopedic Surgery | Admitting: Orthopedic Surgery

## 2021-12-09 DIAGNOSIS — M2391 Unspecified internal derangement of right knee: Secondary | ICD-10-CM | POA: Diagnosis not present

## 2021-12-09 DIAGNOSIS — Z6841 Body Mass Index (BMI) 40.0 and over, adult: Secondary | ICD-10-CM | POA: Insufficient documentation

## 2021-12-09 DIAGNOSIS — E7800 Pure hypercholesterolemia, unspecified: Secondary | ICD-10-CM | POA: Insufficient documentation

## 2021-12-09 DIAGNOSIS — S83281A Other tear of lateral meniscus, current injury, right knee, initial encounter: Secondary | ICD-10-CM | POA: Insufficient documentation

## 2021-12-09 DIAGNOSIS — Z8616 Personal history of COVID-19: Secondary | ICD-10-CM | POA: Insufficient documentation

## 2021-12-09 DIAGNOSIS — Z9981 Dependence on supplemental oxygen: Secondary | ICD-10-CM | POA: Insufficient documentation

## 2021-12-09 DIAGNOSIS — Z9889 Other specified postprocedural states: Secondary | ICD-10-CM

## 2021-12-09 DIAGNOSIS — I10 Essential (primary) hypertension: Secondary | ICD-10-CM | POA: Insufficient documentation

## 2021-12-09 HISTORY — PX: KNEE ARTHROSCOPY: SHX127

## 2021-12-09 SURGERY — ARTHROSCOPY, KNEE
Anesthesia: General | Site: Knee | Laterality: Right

## 2021-12-09 MED ORDER — ACETAMINOPHEN 325 MG PO TABS: 325.0000 mg | ORAL_TABLET | Freq: Four times a day (QID) | ORAL | Status: DC | PRN

## 2021-12-09 MED ORDER — PROMETHAZINE HCL 25 MG/ML IJ SOLN: 6.2500 mg | INTRAMUSCULAR | Status: DC | PRN

## 2021-12-09 MED ORDER — PHENYLEPHRINE HCL-NACL 20-0.9 MG/250ML-% IV SOLN
INTRAVENOUS | Status: DC | PRN
  Administered 2021-12-09: 50 ug/min via INTRAVENOUS

## 2021-12-09 MED ORDER — ONDANSETRON HCL 4 MG/2ML IJ SOLN: 4.0000 mg | Freq: Four times a day (QID) | INTRAMUSCULAR | Status: DC | PRN

## 2021-12-09 MED ORDER — DEXAMETHASONE SODIUM PHOSPHATE 10 MG/ML IJ SOLN
INTRAMUSCULAR | Status: DC | PRN
  Administered 2021-12-09: 10 mg via INTRAVENOUS

## 2021-12-09 MED ORDER — DROPERIDOL 2.5 MG/ML IJ SOLN: 0.6250 mg | Freq: Once | INTRAMUSCULAR | Status: DC | PRN

## 2021-12-09 MED ORDER — METOCLOPRAMIDE HCL 10 MG PO TABS: 5.0000 mg | ORAL_TABLET | Freq: Three times a day (TID) | ORAL | Status: DC | PRN

## 2021-12-09 MED ORDER — PROPOFOL 10 MG/ML IV BOLUS
INTRAVENOUS | Status: DC | PRN
  Administered 2021-12-09: 30 mg via INTRAVENOUS
  Administered 2021-12-09: 170 mg via INTRAVENOUS

## 2021-12-09 MED ORDER — METOCLOPRAMIDE HCL 5 MG/ML IJ SOLN: 5.0000 mg | Freq: Three times a day (TID) | INTRAMUSCULAR | Status: DC | PRN

## 2021-12-09 MED ORDER — FENTANYL CITRATE (PF) 100 MCG/2ML IJ SOLN
INTRAMUSCULAR | Status: AC
  Filled 2021-12-09: qty 2

## 2021-12-09 MED ORDER — CHLORHEXIDINE GLUCONATE 0.12 % MT SOLN
15.0000 mL | Freq: Once | OROMUCOSAL | Status: AC
Start: 2021-12-09 — End: 2021-12-09

## 2021-12-09 MED ORDER — ACETAMINOPHEN 10 MG/ML IV SOLN
INTRAVENOUS | Status: DC | PRN
  Administered 2021-12-09: 1000 mg via INTRAVENOUS

## 2021-12-09 MED ORDER — EPHEDRINE SULFATE (PRESSORS) 50 MG/ML IJ SOLN
INTRAMUSCULAR | Status: DC | PRN
  Administered 2021-12-09: 10 mg via INTRAVENOUS

## 2021-12-09 MED ORDER — MORPHINE SULFATE (PF) 2 MG/ML IV SOLN: 0.5000 mg | INTRAVENOUS | Status: DC | PRN

## 2021-12-09 MED ORDER — ORAL CARE MOUTH RINSE: 15.0000 mL | Freq: Once | OROMUCOSAL | Status: AC

## 2021-12-09 MED ORDER — ACETAMINOPHEN 10 MG/ML IV SOLN: 1000.0000 mg | Freq: Once | INTRAVENOUS | Status: DC | PRN

## 2021-12-09 MED ORDER — BUPIVACAINE-EPINEPHRINE 0.25% -1:200000 IJ SOLN
INTRAMUSCULAR | Status: DC | PRN
  Administered 2021-12-09: 252 mL
  Administered 2021-12-09: 5 mL

## 2021-12-09 MED ORDER — FENTANYL CITRATE (PF) 100 MCG/2ML IJ SOLN: 25.0000 ug | INTRAMUSCULAR | Status: DC | PRN

## 2021-12-09 MED ORDER — PHENYLEPHRINE HCL (PRESSORS) 10 MG/ML IV SOLN
INTRAVENOUS | Status: DC | PRN
  Administered 2021-12-09 (×5): 80 ug via INTRAVENOUS

## 2021-12-09 MED ORDER — ACETAMINOPHEN 10 MG/ML IV SOLN
INTRAVENOUS | Status: AC
  Filled 2021-12-09: qty 100

## 2021-12-09 MED ORDER — MORPHINE SULFATE 4 MG/ML IJ SOLN
INTRAMUSCULAR | Status: DC | PRN
  Administered 2021-12-09: 4 mg via INTRAVENOUS

## 2021-12-09 MED ORDER — ONDANSETRON HCL 4 MG PO TABS: 4.0000 mg | ORAL_TABLET | Freq: Four times a day (QID) | ORAL | Status: DC | PRN

## 2021-12-09 MED ORDER — FAMOTIDINE 20 MG PO TABS: 20.0000 mg | ORAL_TABLET | Freq: Once | ORAL | Status: AC

## 2021-12-09 MED ORDER — ONDANSETRON HCL 4 MG/2ML IJ SOLN
INTRAMUSCULAR | Status: DC | PRN
  Administered 2021-12-09: 4 mg via INTRAVENOUS

## 2021-12-09 MED ORDER — PROPOFOL 10 MG/ML IV BOLUS
INTRAVENOUS | Status: AC
  Filled 2021-12-09: qty 20

## 2021-12-09 MED ORDER — CHLORHEXIDINE GLUCONATE 0.12 % MT SOLN
OROMUCOSAL | Status: AC
  Administered 2021-12-09: 15 mL via OROMUCOSAL
  Filled 2021-12-09: qty 15

## 2021-12-09 MED ORDER — HYDROCODONE-ACETAMINOPHEN 5-325 MG PO TABS: 1.0000 | ORAL_TABLET | ORAL | Status: DC | PRN

## 2021-12-09 MED ORDER — LACTATED RINGERS IV SOLN: INTRAVENOUS | Status: DC

## 2021-12-09 MED ORDER — HYDROCODONE-ACETAMINOPHEN 7.5-325 MG PO TABS: 1.0000 | ORAL_TABLET | ORAL | Status: DC | PRN

## 2021-12-09 MED ORDER — FENTANYL CITRATE (PF) 100 MCG/2ML IJ SOLN
INTRAMUSCULAR | Status: DC | PRN
  Administered 2021-12-09: 25 ug via INTRAVENOUS
  Administered 2021-12-09: 50 ug via INTRAVENOUS
  Administered 2021-12-09: 25 ug via INTRAVENOUS

## 2021-12-09 MED ORDER — SODIUM CHLORIDE 0.9 % IV SOLN: INTRAVENOUS | Status: DC

## 2021-12-09 MED ORDER — MORPHINE SULFATE (PF) 4 MG/ML IV SOLN
INTRAVENOUS | Status: AC
  Filled 2021-12-09: qty 1

## 2021-12-09 MED ORDER — RINGERS IRRIGATION IR SOLN
Status: DC | PRN
  Administered 2021-12-09 (×2): 3000 mL

## 2021-12-09 MED ORDER — OXYCODONE HCL 5 MG/5ML PO SOLN: 5.0000 mg | Freq: Once | ORAL | Status: DC | PRN

## 2021-12-09 MED ORDER — LIDOCAINE HCL (CARDIAC) PF 100 MG/5ML IV SOSY
PREFILLED_SYRINGE | INTRAVENOUS | Status: DC | PRN
  Administered 2021-12-09: 80 mg via INTRAVENOUS

## 2021-12-09 MED ORDER — BUPIVACAINE-EPINEPHRINE (PF) 0.25% -1:200000 IJ SOLN
INTRAMUSCULAR | Status: AC
  Filled 2021-12-09: qty 30

## 2021-12-09 MED ORDER — FAMOTIDINE 20 MG PO TABS
ORAL_TABLET | ORAL | Status: AC
  Administered 2021-12-09: 20 mg via ORAL
  Filled 2021-12-09: qty 1

## 2021-12-09 MED ORDER — HYDROCODONE-ACETAMINOPHEN 5-325 MG PO TABS: 1.0000 | ORAL_TABLET | ORAL | 0 refills | Status: AC | PRN

## 2021-12-09 MED ORDER — OXYCODONE HCL 5 MG PO TABS: 5.0000 mg | ORAL_TABLET | Freq: Once | ORAL | Status: DC | PRN

## 2021-12-09 SURGICAL SUPPLY — 29 items
ADAPTER IRRIG TUBE 2 SPIKE SOL (ADAPTER) ×2 IMPLANT
ADPR TBG 2 SPK PMP STRL ASCP (ADAPTER) ×2
BLADE SHAVER 4.5 DBL SERAT CV (CUTTER) IMPLANT
DRAPE ARTHRO LIMB 89X125 STRL (DRAPES) ×1 IMPLANT
DRSG DERMACEA 8X12 NADH (GAUZE/BANDAGES/DRESSINGS) IMPLANT
DRSG DERMACEA NONADH 3X8 (GAUZE/BANDAGES/DRESSINGS) ×1 IMPLANT
DURAPREP 26ML APPLICATOR (WOUND CARE) ×1 IMPLANT
GAUZE SPONGE 4X4 12PLY STRL (GAUZE/BANDAGES/DRESSINGS) ×1 IMPLANT
GLOVE BIOGEL M STRL SZ7.5 (GLOVE) ×1 IMPLANT
GLOVE SURG UNDER LTX SZ8 (GLOVE) ×1 IMPLANT
GOWN STRL REUS W/ TWL LRG LVL3 (GOWN DISPOSABLE) ×2 IMPLANT
GOWN STRL REUS W/TWL LRG LVL3 (GOWN DISPOSABLE) ×2
IV LACTATED RINGER IRRG 3000ML (IV SOLUTION) ×2
IV LR IRRIG 3000ML ARTHROMATIC (IV SOLUTION) ×2 IMPLANT
KIT TURNOVER KIT A (KITS) ×1 IMPLANT
MANIFOLD NEPTUNE II (INSTRUMENTS) ×1 IMPLANT
PACK ARTHROSCOPY KNEE (MISCELLANEOUS) ×1 IMPLANT
SHAVER BLADE TAPERED BLUNT 4 (BLADE) ×1 IMPLANT
SOL PREP PVP 2OZ (MISCELLANEOUS) ×1
SOLUTION PREP PVP 2OZ (MISCELLANEOUS) ×1 IMPLANT
SPONGE T-LAP 18X18 ~~LOC~~+RFID (SPONGE) IMPLANT
SUT ETHILON 3-0 FS-10 30 BLK (SUTURE) ×1
SUTURE EHLN 3-0 FS-10 30 BLK (SUTURE) ×1 IMPLANT
TUBING INFLOW SET DBFLO PUMP (TUBING) ×1 IMPLANT
TUBING OUTFLOW SET DBLFO PUMP (TUBING) ×1 IMPLANT
WAND HAND CNTRL MULTIVAC 50 (MISCELLANEOUS) ×1 IMPLANT
WATER STERILE IRR 500ML POUR (IV SOLUTION) ×1 IMPLANT
WEBRIL IMPLANT
WRAP KNEE W/COLD PACKS 25.5X14 (SOFTGOODS) ×1 IMPLANT

## 2021-12-09 NOTE — Op Note (Signed)
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   ASSISTANT: none  ANESTHESIA: general  ESTIMATED BLOOD LOSS: Minimal  FLUIDS REPLACED: 800 mL of crystalloid  TOURNIQUET TIME: Not used  INDICATIONS FOR SURGERY: Angelica Barnett is a 71 y.o. year old female who has been seen for complaints of right knee pain. MRI demonstrated findings consistent with meniscal pathology. After discussion of the risks and benefits of surgical intervention, the patient expressed understanding of the risks benefits and agree with plans for right knee arthroscopy.   PROCEDURE IN DETAIL: The patient was brought into the operating room and, after adequate general anesthesia was achieved, a tourniquet was applied to the right thigh and the leg was placed in the leg holder. All bony prominences were well padded. The patient's right knee was cleaned and prepped with alcohol and Duraprep and draped in the usual sterile fashion. A "timeout" was performed as per usual protocol. The anticipated portal sites were injected with 0.25% Marcaine with epinephrine. An anterolateral incision was made and a cannula was inserted. A small effusion was evacuated and the knee was distended with fluid using the pump. The scope was advanced down the medial gutter into the medial compartment. Under visualization with the scope, an anteromedial portal was created and a hooked probe was inserted. The medial meniscus was  visualized and probed.  There was a small radial tear involving the posterior horn of the medial meniscus.  This was debrided using the 50 degree ArthroCare wand.  The remaining portion of the meniscus was visualized and probed and felt to be stable.  The articular cartilage was visualized.  It was an area of grade II-III chondromalacia involving the medial femoral condyle.  This area was debrided and contoured using the 50 degree ArthroCare wand.  The scope was then advanced into the intercondylar notch. The anterior cruciate ligament was visualized and probed and felt to be intact. The scope was removed from the lateral portal and reinserted via the anteromedial portal to better visualize the lateral compartment. The lateral meniscus was visualized and probed.  There was a tear noted to the posterior horn of the lateral meniscus near the root.  This area was debrided and contoured using a combination of the meniscal punches, the incisor shaver, and then contoured using the 50 degree ArthroCare wand.  The remaining portion of meniscus was visualized and probed and felt to be stable.  The articular cartilage of the lateral compartment was visualized.  The articular surface to the lateral compartment was noted to be in good condition.  Finally, the scope was advanced so as to visualize the patellofemoral articulation. Good patellar tracking was appreciated.  There was some grade 2-3 changes of chondromalacia involving the intercondylar sulcus.  This area was debrided and contoured using the ArthroCare wand.  The knee was irrigated with copius amounts of fluid and suctioned dry. The anterolateral portal was re-approximated with #3-0 nylon. A combination of 0.25% Marcaine with epinephrine and 4 mg of Morphine were  injected via the scope. The scope was removed and the anteromedial portal was re-approximated with #3-0 nylon. A sterile dressing was applied followed by application of an ice wrap.  The patient  tolerated the procedure well and was transported to the PACU in stable condition.  Angelica Barnett., M.D.

## 2021-12-09 NOTE — Interval H&P Note (Signed)
History and Physical Interval Note:  12/09/2021 4:09 PM  [name]  has presented today for surgery, with the diagnosis of INTERNAL DERANGEMENT OF RIGHT KNEE..  The various methods of treatment have been discussed with the patient and family. After consideration of risks, benefits and other options for treatment, the patient has consented to  Procedure(s): ARTHROSCOPY KNEE (Right) as a surgical intervention.  The patient's history has been reviewed, patient examined, no change in status, stable for surgery.  I have reviewed the patient's chart and labs.  Questions were answered to the patient's satisfaction.     Angelica Barnett

## 2021-12-09 NOTE — H&P (Signed)
ORTHOPAEDIC HISTORY & PHYSICAL Angelica Barnett, [name]., MD - 11/26/2021 10:00 AM EDT Formatting of this note is different from the original. Images from the original note were not included. Chief Complaint: Chief Complaint  Patient presents with  Right knee pain, MRI 11/18/21   Reason for Visit: The patient is a 71 y.o. female who presents today for reevaluation of her right knee. She reports a 2 month history of right knee pain without any apparent trauma or aggravating event. She localizes most of the pain along the posterior aspect of the knee. She reports some swelling, no locking, and no giving way of the knee. The pain is aggravated by lateral movements and going up an incline . The patient has not appreciated any significant improvement despite Tylenol, NSAIDs, and intraarticular corticosteroid injection.   Medications: Current Outpatient Medications  Medication Sig Dispense Refill  acetaminophen (TYLENOL) 500 MG tablet Take 1,000 mg by mouth every 8 (eight) hours as needed for Pain  albuterol 90 mcg/actuation inhaler Inhale 2 inhalations into the lungs every 4 (four) hours as needed for Wheezing or Shortness of Breath 1 each 1  allopurinoL (ZYLOPRIM) 300 MG tablet Take 1 tablet (300 mg total) by mouth once daily 90 tablet 3  biotin 1 mg tablet Take 1,000 mcg by mouth once daily  CALCIUM CARBONATE/VITAMIN D3 (CALCIUM WITH VITAMIN D3 ORAL) Take 1 tablet by mouth once daily  carvediloL (COREG) 3.125 MG tablet Take 1 tablet (3.125 mg total) by mouth 2 (two) times daily with meals 180 tablet 3  cetirizine (ZYRTEC) 10 MG tablet Take 10 mg by mouth once daily  cholecalciferol (VITAMIN D3) 2,000 unit capsule Take 3,000 Units by mouth once daily Takes in addition to Calcium Carbonate/Vit D3  ibuprofen (ADVIL,MOTRIN) 200 MG tablet Take 200 mg by mouth as needed for Pain.  losartan (COZAAR) 100 MG tablet Take 1 tablet (100 mg total) by mouth once daily 90 tablet 3  montelukast (SINGULAIR) 10 mg  tablet Take 1 tablet (10 mg total) by mouth at bedtime 90 tablet 3   No current facility-administered medications for this visit.   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 Jannette Spanner @ [place] - nml, tortuous colon  COLONOSCOPY 08/22/2005  Dr. Ivor Messier @ [facility] - Int. 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 left knee Left 02/14/2016  Dr.poggi  KNEE ARTHROSCOPY 02/14/2016  Left knee torn miniscus  COLONOSCOPY 12/08/2016  Hyperplastic colon polyp/Repeat 39yr/MUS  carpal tunnel surgery  CATARACT EXTRACTION  CESAREAN SECTION  x 2  ESSURE TUBAL LIGATION  TUBAL LIGATION   Social History: Social History   Socioeconomic History  Marital status: Married  Spouse name: H Juwana Thoreson  Number of children: 2  Years of education: 12  Highest education level: High school graduate  Occupational History  Occupation: Part-time - Radio broadcast assistant  Tobacco Use  Smoking status: Never  Smokeless tobacco: Never  Vaping Use  Vaping  Use: Never used  Substance and Sexual Activity  Alcohol use: Yes  Comment: Have a drink about twice a year  Drug use: Never  Sexual activity: Defer  Partners: Male  Birth control/protection: Surgical  Social History Narrative  Marital status- Married  Lives with husband  Employment- Retired; works part-time  Tobacco hx- None  EtOH hx- None  Drug use- None  Supplements- Calcium with Vitamin D  Exercise hx- None  Religious affliation- [employer]   Family History: Family History  Problem Relation Age of Onset  Stroke Father  High blood pressure (Hypertension) Father  Heart disease Mother  Coronary Artery Disease (Blocked arteries around heart) Mother  Hip fracture Mother  High blood pressure (Hypertension) Mother  Obesity Mother  Osteoporosis (Thinning of bones) Mother  Breast cancer Sister   Review of Systems: A comprehensive 14 point ROS was performed, reviewed, and the pertinent orthopaedic findings are documented in the HPI.  Exam BP 128/84  Ht 162.6 cm ('5\' 4"'$ )  Wt (!) 106.6 kg (235 lb)  BMI 40.34 kg/m   General:  Well-developed, well-nourished female seen in no acute distress.  Even heel to toe gait.  No varus or valgus thrust to the right knee.  HEENT:  Atraumatic, normocephalic. Pupils are equal and reactive to light. Extraocular motion is intact. Sclera are clear. Oropharynx is clear with moist mucosa.  Lungs:  Clear to auscultation bilaterally.  Cardiovascular: Regular rate and rhythm. Normal S1, S2. No murmur . No appreciable gallops or rubs. Peripheral pulses are palpable. No lower extremity edema. Homan`s test is negative.   Extremities: Good strength, stability, and range of motion of the upper extremities. Good range of motion of the hips and ankles.  Right Knee:  Soft tissue swelling: minimal Effusion: none Erythema: none Crepitance: none Tenderness: posterior Alignment: normal Mediolateral laxity: stable Anterior drawer  test:negative Lachman`s test: negative McMurray`s test: positive Atrophy: No significant atrophy.  Quadriceps tone was fair to good. Range of Motion: greater than 120 degrees  Neurologic:  Awake, alert, and oriented.  Sensory function is intact to pinprick and light touch.  Motor strength is judged to be 5/5.  Motor coordination is within normal limits.  No apparent clonus. No tremor.   MRI: I reviewed the right knee MRI from [facility] dated 11/19/2021. I concur with the radiologist's interpretation as below:  MRI OF THE RIGHT KNEE WITHOUT CONTRAST   TECHNIQUE:  Multiplanar, multisequence MR imaging of the knee was performed. No  intravenous contrast was administered.   COMPARISON: None Available.   FINDINGS:  MENISCI   Medial: Radial tear of the root of the posterior horn of the medial  meniscus with peripheral meniscal extrusion.   Lateral: Degeneration of the lateral meniscus with a partial radial  tear of the free edge of the posterior horn.   LIGAMENTS   Cruciates: ACL and PCL are intact.   Collaterals: Medial collateral ligament is intact. Lateral  collateral ligament complex is intact.   CARTILAGE   Patellofemoral: High-grade partial-thickness cartilage loss areas of  full-thickness cartilage loss of the lateral patellofemoral  compartment. High-grade partial-thickness cartilage loss of the  medial patellar facet.   Medial: Partial-thickness cartilage loss of the  medial femorotibial  compartment.   Lateral: Mild partial-thickness cartilage loss of the lateral  femorotibial compartment.   JOINT: No joint effusion. Edema in superolateral Hoffa's fat. No  plical thickening.   POPLITEAL FOSSA: Popliteus tendon is intact. No Baker's cyst.   EXTENSOR MECHANISM: Intact quadriceps tendon. Intact patellar  tendon. Intact lateral patellar retinaculum. Intact medial patellar  retinaculum. Intact MPFL.   BONES: No aggressive osseous  lesion. No fracture or dislocation.   Other: No fluid collection or hematoma. Muscles are normal.   IMPRESSION:  1. Radial tear of the root of the posterior horn of the medial  meniscus.  2. Degeneration of the lateral meniscus with a partial radial tear  of the free edge of the posterior horn.  3. Tricompartmental cartilage abnormalities as described above.  4. Edema in superolateral Hoffa's fat as can be seen with patellar  tendon-lateral femoral condyle friction syndrome.   Electronically Signed  By: Kathreen Devoid M.D.  On: 11/19/2021 11:34   Impression: Internal derangement of the right knee  Plan:  The findings were discussed in detail with the patient. The patient was given informational material on knee arthroscopy. Conservative treatment options were reviewed with the patient. We discussed the risks and benefits of surgical intervention. Arthroscopy is an appropriate treatment for the meniscal pathology, but would have limited or no effect on degenerative changes of the articular cartilage. The usual perioperative course was also discussed in detail. The patient expressed understanding of the risks and benefits of surgical intervention and would like to proceed with plans for right knee arthroscopy.  I spent a total of 40 minutes in both face-to-face and non-face-to-face activities, excluding procedures performed, for this visit on the date of this encounter.  MEDICAL CLEARANCE: Per anesthesiology. ACTIVITIES:  Avoid pivoting, squatting, or twisting. WORK STATUS: Anticipate out of work for 2-3 weeks. THERAPY: Quadriceps strengthening exercises. MEDICATIONS: Requested Prescriptions   No prescriptions requested or ordered in this encounter   FOLLOW-UP: Return for postoperative follow-up.   Maekayla Giorgio P. Holley Bouche., M.D.  This note was generated in part with voice recognition software and I apologize for any typographical errors that were not detected and corrected.   Electronically signed by Lamar Benes., MD at 12/01/2021 9:05 AM EDT

## 2021-12-09 NOTE — Transfer of Care (Signed)
Immediate Anesthesia Transfer of Care Note  Patient: LILLA CALLEJO  Procedure(s) Performed: ARTHROSCOPY KNEE (Right: Knee)  Patient Location: PACU  Anesthesia Type:General  Level of Consciousness: drowsy  Airway & Oxygen Therapy: Patient Spontanous Breathing and Patient connected to face mask oxygen  Post-op Assessment: Report given to RN  Post vital signs: stable  Last Vitals:  Vitals Value Taken Time  BP 142/91 12/09/21 1817  Temp 36.3 C 12/09/21 1820  Pulse 99 12/09/21 1820  Resp 16 12/09/21 1820  SpO2 100 % 12/09/21 1820  Vitals shown include unvalidated device data.  Last Pain:  Vitals:   12/09/21 1418  TempSrc: Temporal  PainSc: 1          Complications: No notable events documented.

## 2021-12-09 NOTE — Anesthesia Postprocedure Evaluation (Signed)
Anesthesia Post Note  Patient: Angelica Barnett  Procedure(s) Performed: ARTHROSCOPY KNEE (Right: Knee)  Patient location during evaluation: PACU Anesthesia Type: General Level of consciousness: awake and alert Pain management: pain level controlled Vital Signs Assessment: post-procedure vital signs reviewed and stable Respiratory status: spontaneous breathing, nonlabored ventilation, respiratory function stable and patient connected to nasal cannula oxygen Cardiovascular status: blood pressure returned to baseline and stable Postop Assessment: no apparent nausea or vomiting Anesthetic complications: no   No notable events documented.   Last Vitals:  Vitals:   12/09/21 1845 12/09/21 1854  BP: (!) 149/81 (!) 153/92  Pulse: 97 97  Resp: 16 18  Temp: 36.8 C 36.7 C  SpO2: 91% 96%    Last Pain:  Vitals:   12/09/21 1854  TempSrc: Temporal  PainSc: 0-No pain                 Dimas Millin

## 2021-12-09 NOTE — Anesthesia Procedure Notes (Signed)
Procedure Name: LMA Insertion Date/Time: 12/09/2021 4:42 PM  Performed by: Nelda Marseille, CRNAPre-anesthesia Checklist: Patient identified, Patient being monitored, Timeout performed, Emergency Drugs available and Suction available Patient Re-evaluated:Patient Re-evaluated prior to induction Oxygen Delivery Method: Circle system utilized Preoxygenation: Pre-oxygenation with 100% oxygen Induction Type: IV induction Ventilation: Mask ventilation without difficulty LMA: LMA inserted LMA Size: 4.0 Tube type: Oral Number of attempts: 1 Placement Confirmation: positive ETCO2 and breath sounds checked- equal and bilateral Tube secured with: Tape Dental Injury: Teeth and Oropharynx as per pre-operative assessment

## 2021-12-09 NOTE — Anesthesia Preprocedure Evaluation (Addendum)
Anesthesia Evaluation  Patient identified by MRN, date of birth, ID band Patient awake    Reviewed: Allergy & Precautions, NPO status , Patient's Chart, lab work & pertinent test results  Airway Mallampati: II  TM Distance: >3 FB Neck ROM: full    Dental no notable dental hx.    Pulmonary shortness of breath and with exertion,  Post-Covid 19 and influenza A pneumonitis with no longer need for oxygen  PFTs: 01/08/21- FEV1- 104% 12/05/21- FEV1 - 109%   ARISCAT (Canet) Preoperative pulmonary risk index in adults- low risk: 1.6% pulmonary postoperative complication rate Arozullah respiratory failure index- low risk: 1.8% pulmonary postoperative complication rate Lyndel Safe calculator for postoperative respiratory failure- low risk: 1.07% probability of postoperative respiratory failure    Pulmonary exam normal        Cardiovascular hypertension, Normal cardiovascular exam  EKG NSR  ECHO 1/21: 1. The left ventricle has normal function. There is no increased left  ventricular wall thickness.  2. The left ventricle has no regional wall motion abnormalities.  3. Global right ventricle has normal systolc function.The right  ventricular size is normal. no increase in right ventricular wall  thickness.  4. The mitral valve is normal in structure. Trivial mitral valve  regurgitation.  5. The tricuspid valve was normal in structure. Tricuspid valve  regurgitation is trivial.  6. Tricuspid valve regurgitation is trivial.  7. The pulmonic valve was normal in structure   Neuro/Psych negative neurological ROS  negative psych ROS   GI/Hepatic Neg liver ROS, GERD  Controlled,  Endo/Other  Morbid obesity  Renal/GU      Musculoskeletal  (+) Arthritis ,   Abdominal (+) + obese,   Peds  Hematology negative hematology ROS (+)   Anesthesia Other Findings Past Medical History: No date: Anemia     Comment:  H/O WITH C/S No  date: Arthritis No date: Cataract cortical, senile, bilateral No date: Family history of adverse reaction to anesthesia     Comment:  PT MOM-N/V No date: GERD (gastroesophageal reflux disease)     Comment:  NO MEDS No date: Gout No date: Headache     Comment:  H/O MIGRAINES No date: Heart murmur No date: Hypercholesterolemia No date: Hyperplastic colonic polyp No date: Hypertension No date: Insomnia No date: Obesity 04/2019: Pneumonia due to COVID-19 virus     Comment:  acute respiratory failure; hypoxia, ARDS (hospital               admission)  Past Surgical History: No date: BILATERAL CARPAL TUNNEL RELEASE No date: CESAREAN SECTION     Comment:  X2 08/22/2005: COLONOSCOPY 12/08/2016: COLONOSCOPY WITH PROPOFOL; N/A     Comment:  Procedure: COLONOSCOPY WITH PROPOFOL;  Surgeon:               Lollie Sails, MD;  Location: [facility] END[facility]COPY;                Service: Endoscopy;  Laterality: N/A; No date: EYE SURGERY; Bilateral     Comment:  CATARACT 09/18/1999: FLEXIBLE SIGMOIDOSCOPY 02/14/2016: KNEE ARTHROSCOPY WITH MENISCAL REPAIR; Left     Comment:  Procedure: KNEE ARTHROSCOPY WITH MENISCAL               REPAIR,debridement;  Surgeon: Corky Mull, MD;                Location: [facility] ORS;  Service: Orthopedics;  Laterality:               Left; No  date: TUBAL LIGATION     Reproductive/Obstetrics negative OB ROS                         Anesthesia Physical Anesthesia Plan  ASA: 3  Anesthesia Plan: General LMA   Post-op Pain Management: Toradol IV (intra-op)* and Ofirmev IV (intra-op)*   Induction: Intravenous  PONV Risk Score and Plan: Dexamethasone, Ondansetron and Midazolam  Airway Management Planned: LMA  Additional Equipment:   Intra-op Plan:   Post-operative Plan: Extubation in OR  Informed Consent:     Dental Advisory Given  Plan Discussed with: Anesthesiologist, CRNA and Surgeon  Anesthesia Plan Comments:      Anesthesia  Quick Evaluation

## 2021-12-10 ENCOUNTER — Encounter: Payer: Self-pay | Admitting: Orthopedic Surgery

## 2022-02-21 ENCOUNTER — Other Ambulatory Visit: Payer: Self-pay | Admitting: Internal Medicine

## 2022-02-21 DIAGNOSIS — Z1231 Encounter for screening mammogram for malignant neoplasm of breast: Secondary | ICD-10-CM

## 2022-03-05 ENCOUNTER — Ambulatory Visit
Admission: RE | Admit: 2022-03-05 | Discharge: 2022-03-05 | Disposition: A | Discharge status: Home / Self Care | Payer: Medicare Other | Source: Ambulatory Visit | Attending: Internal Medicine | Admitting: Internal Medicine

## 2022-03-05 DIAGNOSIS — Z1231 Encounter for screening mammogram for malignant neoplasm of breast: Secondary | ICD-10-CM | POA: Insufficient documentation

## 2022-03-11 MED FILL — Bupivacaine Inj 0.25% w/ Epinephrine 1:200000: INTRAMUSCULAR | Qty: 50 | Status: AC

## 2023-06-05 ENCOUNTER — Other Ambulatory Visit: Payer: Self-pay | Admitting: Internal Medicine

## 2023-06-05 DIAGNOSIS — Z1231 Encounter for screening mammogram for malignant neoplasm of breast: Secondary | ICD-10-CM

## 2023-06-15 ENCOUNTER — Ambulatory Visit
Admission: RE | Admit: 2023-06-15 | Discharge: 2023-06-15 | Disposition: A | Discharge status: Home / Self Care | Payer: Medicare Other | Source: Ambulatory Visit | Attending: Internal Medicine | Admitting: Internal Medicine

## 2023-06-15 DIAGNOSIS — Z1231 Encounter for screening mammogram for malignant neoplasm of breast: Secondary | ICD-10-CM | POA: Insufficient documentation
